# Patient Record
Sex: Male | Born: 1990 | Race: White | Hispanic: No | Marital: Single | Smoking: Never smoker
Health system: Southern US, Community
[De-identification: ages and names within clinical notes are randomized; demographics above are authoritative.]

## PROBLEM LIST (undated history)

## (undated) DIAGNOSIS — F329 Major depressive disorder, single episode, unspecified: Secondary | ICD-10-CM

## (undated) DIAGNOSIS — F32A Depression, unspecified: Secondary | ICD-10-CM

## (undated) HISTORY — DX: Major depressive disorder, single episode, unspecified: F32.9

## (undated) HISTORY — DX: Depression, unspecified: F32.A

---
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
## 2013-08-04 ENCOUNTER — Ambulatory Visit: Payer: BC Managed Care – PPO | Admitting: Emergency Medicine

## 2013-08-04 VITALS — BP 112/68 | HR 90 | Temp 99.4°F | Resp 20 | Ht 69.5 in | Wt 140.0 lb

## 2013-08-04 DIAGNOSIS — F329 Major depressive disorder, single episode, unspecified: Secondary | ICD-10-CM

## 2013-08-04 DIAGNOSIS — F3289 Other specified depressive episodes: Secondary | ICD-10-CM

## 2013-08-04 DIAGNOSIS — F32A Depression, unspecified: Secondary | ICD-10-CM

## 2013-08-04 MED ORDER — SERTRALINE HCL 50 MG PO TABS: 50.0000 mg | ORAL_TABLET | Freq: Every day | ORAL | Status: DC

## 2013-08-04 NOTE — Progress Notes (Signed)
[facility] [address], [place] [id number] [phone]  Date:  08/04/2013   Name:  Jacob BandaGriffin K Staubs   DOB:  Aug 21, 1990   MRN:  [id number]  PCP:  Tally DueGUEST, CHRIS WARREN, MD    Chief Complaint: Depression   History of Present Illness:  Jacob BandaGriffin K Thumm is a 23 y.o. very pleasant male patient who presents with the following:  Has suffered depression last term and again this.  Went to counseling center last semester and after first session though he was better and canceled the next appt.  Now has experienced worsening depression and has resumed treatment. Counselor is concerned said he feels anxious.  He says he has lost interest and focus in school and has abandoned his long standing friends.  Not doing as well as he was with classes.  Not eating and sleeping poorly.  No girlfriend.  Has appt next week with a psychologist.  Afraid to go out in crowds.  No improvement with over the counter medications or other home remedies. Denies other complaint or health concern today.   There are no active problems to display for this patient.   Past Medical History  Diagnosis Date  . Depression     History reviewed. No pertinent past surgical history.  History  Substance Use Topics  . Smoking status: Never Smoker   . Smokeless tobacco: Not on file  . Alcohol Use: No    History reviewed. No pertinent family history.  Not on File  Medication list has been reviewed and updated.  No current outpatient prescriptions on file prior to visit.   No current facility-administered medications on file prior to visit.    Review of Systems:  As per HPI, otherwise negative.    Physical Examination: Filed Vitals:   08/04/13 1440  BP: 112/68  Pulse: 90  Temp: 99.4 F (37.4 C)  Resp: 20   Filed Vitals:   08/04/13 1440  Height: 5' 9.5" (1.765 m)  Weight: 140 lb (63.504 kg)   Body mass index is 20.39 kg/(m^2). Ideal Body Weight: Weight in (lb) to have BMI = 25:  171.4  GEN: WDWN, NAD, Non-toxic, A & O x 3 HEENT: Atraumatic, Normocephalic. Neck supple. No masses, No LAD. Ears and Nose: No external deformity. CV: RRR, No M/G/R. No JVD. No thrill. No extra heart sounds. PULM: CTA B, no wheezes, crackles, rhonchi. No retractions. No resp. distress. No accessory muscle use. ABD: S, NT, ND, +BS. No rebound. No HSM. EXTR: No c/c/e NEURO Normal gait.  PSYCH: Normally interactive. Conversant. Not depressed or anxious appearing.  Calm demeanor.    Assessment and Plan: Social anxiety disorder Depression zoloft Follow up with therapist  Signed,  Phillips OdorJeffery Kandon Hosking, MD

## 2013-08-04 NOTE — Patient Instructions (Signed)

## 2013-12-22 ENCOUNTER — Other Ambulatory Visit: Payer: Self-pay | Admitting: Emergency Medicine

## 2015-12-03 ENCOUNTER — Encounter: Payer: Self-pay | Admitting: Podiatry

## 2015-12-03 ENCOUNTER — Ambulatory Visit (INDEPENDENT_AMBULATORY_CARE_PROVIDER_SITE_OTHER): Payer: BLUE CROSS/BLUE SHIELD | Admitting: Podiatry

## 2015-12-03 VITALS — BP 117/76 | HR 64 | Resp 16 | Ht 69.0 in | Wt 140.0 lb

## 2015-12-03 DIAGNOSIS — L603 Nail dystrophy: Secondary | ICD-10-CM

## 2015-12-03 NOTE — Progress Notes (Signed)
   Subjective:    Patient ID: Jacob Mcdowell, male    DOB: 01/30/1991, 25 y.o.   MRN: [id number]  HPI: Jacob Mcdowell presents with his mother today for chief complaint of a thickened discolored and deformed nail hallux right as well as the fifth digit of the left foot. He states they've been this way for quite some time and seem to be worsening. He's tried nothing to treat them.    Review of Systems  All other systems reviewed and are negative.      Objective:   Physical Exam: Vital signs are stable alert and oriented 3 pulses are palpable. Neurologic sensorium is intact deep tendon reflexes are intact muscle strength is 5 over 5 dorsiflexion plantar flexors and inverters everters all physical musculature is intact. Orthopedic evaluation of his rheumatologist is the ankle range of motion without crepitation. Cutaneous evaluationof the well-hydrated cutis thick yellow dystrophic clinically mycotic nail hallux right and fifth digit left foot.        Assessment & Plan:  Nail dystrophy hallux right fifth digit left.  Plan: A sample of the nails he will taken today to be sent for pathologic evaluation we will notify them of the results.

## 2016-01-07 ENCOUNTER — Encounter: Payer: Self-pay | Admitting: Podiatry

## 2016-01-07 ENCOUNTER — Ambulatory Visit (INDEPENDENT_AMBULATORY_CARE_PROVIDER_SITE_OTHER): Payer: BLUE CROSS/BLUE SHIELD | Admitting: Podiatry

## 2016-01-07 DIAGNOSIS — L603 Nail dystrophy: Secondary | ICD-10-CM

## 2016-01-07 DIAGNOSIS — Z79899 Other long term (current) drug therapy: Secondary | ICD-10-CM

## 2016-01-07 MED ORDER — TERBINAFINE HCL 250 MG PO TABS: 250.0000 mg | ORAL_TABLET | Freq: Every day | ORAL | Status: DC

## 2016-01-07 NOTE — Patient Instructions (Signed)

## 2016-01-07 NOTE — Progress Notes (Signed)
Jacob Mcdowell presents today for follow-up of his toenail cultures.  Objective: Vital signs are stable alert and oriented 3. Pulses are palpable. Pathology report does demonstrate positive for onychomycosis and amount of ice.  Assessment: Onychomycosis.  Plan: After lengthy discussion pros and cons of oral versus topical therapy versus laser therapy he chose oral therapy. We discussed this in great detail today the pros and cons of the medication itself the risk that he'll be taking utilizing this medication. Also we requested a liver profile to be performed and a requisition was provided. I prescribed Lamisil 250 mg tablets 1 by mouth daily 30 and he will follow-up with me in 1 month for another liver profile at which time 90 tablets will be dispensed.

## 2016-01-08 ENCOUNTER — Telehealth: Payer: Self-pay | Admitting: *Deleted

## 2016-01-08 LAB — HEPATIC FUNCTION PANEL
ALBUMIN: 5.1 g/dL (ref 3.6–5.1)
ALT: 11 U/L (ref 9–46)
AST: 17 U/L (ref 10–40)
Alkaline Phosphatase: 51 U/L (ref 40–115)
Bilirubin, Direct: 0.1 mg/dL (ref <=0.2)
Indirect Bilirubin: 0.6 mg/dL (ref 0.2–1.2)
Total Bilirubin: 0.7 mg/dL (ref 0.2–1.2)
Total Protein: 7.3 g/dL (ref 6.1–8.1)

## 2016-01-08 NOTE — Telephone Encounter (Addendum)
-----   Message from Elinor ParkinsonMax T Hyatt, [place] sent at 01/08/2016 12:13 PM EDT ----- Blood work looks good and may continue medication. Informed pt of Dr.Hyatt's orders.

## 2016-01-28 ENCOUNTER — Encounter: Payer: Self-pay | Admitting: Podiatry

## 2016-01-28 ENCOUNTER — Ambulatory Visit (INDEPENDENT_AMBULATORY_CARE_PROVIDER_SITE_OTHER): Payer: BLUE CROSS/BLUE SHIELD | Admitting: Podiatry

## 2016-01-28 DIAGNOSIS — L603 Nail dystrophy: Secondary | ICD-10-CM

## 2016-01-28 DIAGNOSIS — Z79899 Other long term (current) drug therapy: Secondary | ICD-10-CM

## 2016-01-28 MED ORDER — TERBINAFINE HCL 250 MG PO TABS: 250.0000 mg | ORAL_TABLET | Freq: Every day | ORAL | 0 refills | Status: DC

## 2016-01-28 NOTE — Progress Notes (Signed)
Jacob CruiseKristin presents today for follow-up of his onychomycosis and treatment with Lamisil. He states that he has had no problems taking the Lamisil. He denies fever chills nausea vomiting muscle aches and pains.  Objective: No change in the nail plates as of yet that I can see that he states that they look much better.  Assessment: Onychomycosis.  Plan: Long-term treatment with Lamisil. He will start his 90 day treatment regimen at this point. We requested another liver profile and CBC and we dispensed another 90 days with medication. I will follow-up with him in 4 months.

## 2016-06-04 ENCOUNTER — Ambulatory Visit (INDEPENDENT_AMBULATORY_CARE_PROVIDER_SITE_OTHER): Payer: BLUE CROSS/BLUE SHIELD | Admitting: Podiatry

## 2016-06-04 ENCOUNTER — Encounter: Payer: Self-pay | Admitting: Podiatry

## 2016-06-04 DIAGNOSIS — L603 Nail dystrophy: Secondary | ICD-10-CM | POA: Diagnosis not present

## 2016-06-04 DIAGNOSIS — Z79899 Other long term (current) drug therapy: Secondary | ICD-10-CM

## 2016-06-04 MED ORDER — TERBINAFINE HCL 250 MG PO TABS: 250.0000 mg | ORAL_TABLET | Freq: Every day | ORAL | 0 refills | Status: AC

## 2016-06-07 NOTE — Progress Notes (Signed)
He presents today for follow-up of his onychomycosis and the use of Lamisil. He states that he only took 2 months of the Lamisil but his nails are growing out nicely. He also goes on to state that he forgot to get his blood drawn.  Objective: Vital signs are stable he is alert and oriented 3. Pulses are palpable. Nail plates appear to be healing up proximally 50%.  Assessment: Long-term therapy for onychomycosis with Lamisil.  Plan: Continue use of Lamisil for 3 more months and I requested another liver profile while he is at home from college.

## 2016-10-27 ENCOUNTER — Encounter (INDEPENDENT_AMBULATORY_CARE_PROVIDER_SITE_OTHER): Payer: Self-pay | Admitting: Podiatry

## 2016-10-27 NOTE — Progress Notes (Signed)
This encounter was created in error - please disregard.

## 2019-03-11 DIAGNOSIS — Z131 Encounter for screening for diabetes mellitus: Secondary | ICD-10-CM | POA: Diagnosis not present

## 2019-03-11 DIAGNOSIS — Z0000 Encounter for general adult medical examination without abnormal findings: Secondary | ICD-10-CM | POA: Diagnosis not present

## 2019-03-11 DIAGNOSIS — Z23 Encounter for immunization: Secondary | ICD-10-CM | POA: Diagnosis not present

## 2019-06-14 ENCOUNTER — Ambulatory Visit: Payer: 59 | Attending: Internal Medicine

## 2019-06-14 DIAGNOSIS — Z20822 Contact with and (suspected) exposure to covid-19: Secondary | ICD-10-CM

## 2019-06-16 LAB — NOVEL CORONAVIRUS, NAA: SARS-CoV-2, NAA: NOT DETECTED

## 2020-06-06 ENCOUNTER — Telehealth: Payer: Self-pay | Admitting: Hematology

## 2020-06-06 NOTE — Telephone Encounter (Signed)
Received a new hem referral from [facility] for leukopenia. Mr. Jacob Mcdowell has been cld and scheduled to see Dr. Candise Che on 12/22 at 11am. Pt aware to arrive 30 minutes early.

## 2020-06-12 ENCOUNTER — Other Ambulatory Visit: Payer: Self-pay

## 2020-06-12 ENCOUNTER — Inpatient Hospital Stay: Payer: 59

## 2020-06-12 ENCOUNTER — Inpatient Hospital Stay: Payer: 59 | Attending: Hematology | Admitting: Hematology

## 2020-06-12 VITALS — BP 135/78 | HR 75 | Temp 97.6°F | Resp 18 | Ht 69.0 in | Wt 157.8 lb

## 2020-06-12 DIAGNOSIS — R112 Nausea with vomiting, unspecified: Secondary | ICD-10-CM | POA: Insufficient documentation

## 2020-06-12 DIAGNOSIS — E538 Deficiency of other specified B group vitamins: Secondary | ICD-10-CM | POA: Insufficient documentation

## 2020-06-12 DIAGNOSIS — R5383 Other fatigue: Secondary | ICD-10-CM | POA: Diagnosis not present

## 2020-06-12 DIAGNOSIS — D709 Neutropenia, unspecified: Secondary | ICD-10-CM | POA: Insufficient documentation

## 2020-06-12 DIAGNOSIS — F418 Other specified anxiety disorders: Secondary | ICD-10-CM | POA: Diagnosis not present

## 2020-06-12 DIAGNOSIS — R531 Weakness: Secondary | ICD-10-CM | POA: Insufficient documentation

## 2020-06-12 DIAGNOSIS — Z79899 Other long term (current) drug therapy: Secondary | ICD-10-CM | POA: Diagnosis not present

## 2020-06-12 LAB — CMP (CANCER CENTER ONLY)
ALT: 14 U/L (ref 0–44)
AST: 15 U/L (ref 15–41)
Albumin: 4.9 g/dL (ref 3.5–5.0)
Alkaline Phosphatase: 56 U/L (ref 38–126)
Anion gap: 8 (ref 5–15)
BUN: 10 mg/dL (ref 6–20)
CO2: 30 mmol/L (ref 22–32)
Calcium: 9.6 mg/dL (ref 8.9–10.3)
Chloride: 105 mmol/L (ref 98–111)
Creatinine: 0.82 mg/dL (ref 0.61–1.24)
GFR, Estimated: >60 mL/min (ref >60)
Glucose, Bld: 92 mg/dL (ref 70–99)
Potassium: 4.4 mmol/L (ref 3.5–5.1)
Sodium: 143 mmol/L (ref 135–145)
Total Bilirubin: 0.6 mg/dL (ref 0.3–1.2)
Total Protein: 8 g/dL (ref 6.5–8.1)

## 2020-06-12 LAB — CBC WITH DIFFERENTIAL/PLATELET
Abs Immature Granulocytes: 0 10*3/uL (ref 0.00–0.07)
Basophils Absolute: 0 10*3/uL (ref 0.0–0.1)
Basophils Relative: 1 %
Eosinophils Absolute: 0.1 10*3/uL (ref 0.0–0.5)
Eosinophils Relative: 3 %
HCT: 43.8 % (ref 39.0–52.0)
Hemoglobin: 14.8 g/dL (ref 13.0–17.0)
Immature Granulocytes: 0 %
Lymphocytes Relative: 30 %
Lymphs Abs: 1.4 10*3/uL (ref 0.7–4.0)
MCH: 31.2 pg (ref 26.0–34.0)
MCHC: 33.8 g/dL (ref 30.0–36.0)
MCV: 92.4 fL (ref 80.0–100.0)
Monocytes Absolute: 0.4 10*3/uL (ref 0.1–1.0)
Monocytes Relative: 9 %
Neutro Abs: 2.8 10*3/uL (ref 1.7–7.7)
Neutrophils Relative %: 57 %
Platelets: 251 10*3/uL (ref 150–400)
RBC: 4.74 MIL/uL (ref 4.22–5.81)
RDW: 11.8 % (ref 11.5–15.5)
WBC: 4.8 10*3/uL (ref 4.0–10.5)
nRBC: 0 % (ref 0.0–0.2)

## 2020-06-12 LAB — LACTATE DEHYDROGENASE: LDH: 151 U/L (ref 98–192)

## 2020-06-12 LAB — VITAMIN B12: Vitamin B-12: 765 pg/mL (ref 180–914)

## 2020-06-12 NOTE — Progress Notes (Signed)
HEMATOLOGY/ONCOLOGY CONSULTATION NOTE  Date of Service: 06/12/2020  Patient Care Team: Elias Elseeade, Robert, MD as PCP - General (Family Medicine)  CHIEF COMPLAINTS/PURPOSE OF CONSULTATION:  Neutropenia  HISTORY OF PRESENTING ILLNESS:   Jacob Mcdowell is a wonderful 29 y.o. male who has been referred to us by Raenette RoverWilliam Stein, PA-C for evaluation and management of neutropenia. The pt reports that he is doing well overall.   The pt reports persistent fatigue for 6-12 months that began suddenly. In August the pt was riding in a car when he suddenly began feeling nauseous and weak. Pt was bedridden for three days due to this. This has occurred on every car ride longer than 2 hours since then. Pt has no history of carsickness since he was a small child. Pt denies dizziness or a spinning sensation during these episodes. He is now experiencing nausea, weakness, and fatigue on a regular basis. He notes that anxiety appears to amplify these symptoms. He associates activity with feeling weak and nauseous.   Pt has been seen by an ENT, who cleaned his ears and did a throat evaluation. Pt was having, and continues to have a feeling of fullness in his throat. He denies difficulty swallowing but has intermittent throat soreness. In the last 1-2 weeks he has noticed dark brown stools. Pt has no dietary associations with episodes of diarrhea. Pt was found to have low Vitamin B12 two months ago. He has since been receiving weekly B12 injections, as well as taking sublingual Vitamin B12. His thyroid function was checked and found to be nml. Pt had a positive ANA last week.   Pt has been on Zoloft for about 7 years. He has been taking Ibuprofen almost every other day. 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 He is planning on changing jobs soon, which is causing some stress.  Most recent lab results (06/05/2020) of CBC is as follows: all values are WNL except for WBC at 3.6K, Neutro Abs  at 1.6K. 06/05/2020 Vitamin B12 at 914  On review of systems, pt reports fatigue, weakness, nausea, anxiety, throat fullness and denies abdominal pain/distention, rhinorrhea, dysphagia, pharyngitis, joint pain/swelling, rash, mouth sores, unexpected weight loss, fevers, chills, night sweats, tingling/numbness in hands/feet, hearing changes and any other symptoms.   On PMHx the pt reports Wisdom tooth extraction, Anxiety, Vitamin B12 deficiency. On Social Hx the pt reports a history vaping. He has used flavored vapes as well as e-cigarettes. He was a heavy marijuana user until eight months ago. He now uses marijuana occasionally. Pt has never been a cigarette smoker. Pt was previously drinking three alcoholic beverages per week but has not used any alcohol in the last few months.  On Family Hx the pt reports that his mother has hypothyroidism, diagnosed in her 20-30's. His paternal grandparents passed from Lung Cancer and were smokers.    MEDICAL HISTORY:  Past Medical History:  Diagnosis Date  . Depression     SURGICAL HISTORY: Wisdom tooth extraction  SOCIAL HISTORY: Social History   Socioeconomic History  . Marital status: Single    Spouse name: Not on file  . Number of children: Not on file  . Years of education: Not on file  . Highest education level: Not on file  Occupational History  . Not on file  Tobacco Use  . Smoking status: Never Smoker  . Smokeless tobacco: Not on file  Substance and Sexual Activity  . Alcohol use: No  .  Drug use: No  . Sexual activity: Not on file  Other Topics Concern  . Not on file  Social History Narrative  . Not on file   Social Determinants of Health   Financial Resource Strain: Not on file  Food Insecurity: Not on file  Transportation Needs: Not on file  Physical Activity: Not on file  Stress: Not on file  Social Connections: Not on file  Intimate Partner Violence: Not on file    FAMILY HISTORY: No family history on  file.  ALLERGIES:  has No Known Allergies.  MEDICATIONS:  Current Outpatient Medications  Medication Sig Dispense Refill  . sertraline (ZOLOFT) 50 MG tablet TAKE 1 TABLET BY MOUTH EVERY DAY 30 tablet 1  . terbinafine (LAMISIL) 250 MG tablet Take 1 tablet (250 mg total) by mouth daily. 90 tablet 0   No current facility-administered medications for this visit.    REVIEW OF SYSTEMS:    10 Point review of Systems was done is negative except as noted above.  PHYSICAL EXAMINATION: ECOG PERFORMANCE STATUS: 1 - Symptomatic but completely ambulatory  . Vitals:   06/12/20 1145  BP: 135/78  Pulse: 75  Resp: 18  Temp: 97.6 F (36.4 C)  SpO2: 99%   Filed Weights   06/12/20 1145  Weight: 157 lb 12.8 oz (71.6 kg)   .Body mass index is 23.3 kg/m.  GENERAL:alert, in no acute distress and comfortable SKIN: no acute rashes, no significant lesions EYES: conjunctiva are pink and non-injected, sclera anicteric OROPHARYNX: MMM, no exudates, no oropharyngeal erythema or ulceration NECK: supple, no JVD LYMPH:  no palpable lymphadenopathy in the cervical, axillary or inguinal regions LUNGS: clear to auscultation b/l with normal respiratory effort HEART: regular rate & rhythm ABDOMEN:  normoactive bowel sounds , non tender, not distended. No palpable hepato-splenomegaly. Extremity: no pedal edema PSYCH: alert & oriented x 3 with fluent speech NEURO: no focal motor/sensory deficits  LABORATORY DATA:  I have reviewed the data as listed  . CBC Latest Ref Rng & Units 06/12/2020  WBC 4.0 - 10.5 K/uL 4.8  Hemoglobin 13.0 - 17.0 g/dL 16.1  Hematocrit 09.6 - 52.0 % 43.8  Platelets 150 - 400 K/uL 251   . CBC    Component Value Date/Time   WBC 4.8 06/12/2020 1237   RBC 4.74 06/12/2020 1237   HGB 14.8 06/12/2020 1237   HCT 43.8 06/12/2020 1237   PLT 251 06/12/2020 1237   MCV 92.4 06/12/2020 1237   MCH 31.2 06/12/2020 1237   MCHC 33.8 06/12/2020 1237   RDW 11.8 06/12/2020 1237    LYMPHSABS 1.4 06/12/2020 1237   MONOABS 0.4 06/12/2020 1237   EOSABS 0.1 06/12/2020 1237   BASOSABS 0.0 06/12/2020 1237   ANC 2.8k  . CMP Latest Ref Rng & Units 06/12/2020 01/07/2016  Glucose 70 - 99 mg/dL 92 -  BUN 6 - 20 mg/dL 10 -  Creatinine 0.45 - 1.24 mg/dL 4.09 -  Sodium 811 - 914 mmol/L 143 -  Potassium 3.5 - 5.1 mmol/L 4.4 -  Chloride 98 - 111 mmol/L 105 -  CO2 22 - 32 mmol/L 30 -  Calcium 8.9 - 10.3 mg/dL 9.6 -  Total Protein 6.5 - 8.1 g/dL 8.0 7.3  Total Bilirubin 0.3 - 1.2 mg/dL 0.6 0.7  Alkaline Phos 38 - 126 U/L 56 51  AST 15 - 41 U/L 15 17  ALT 0 - 44 U/L 14 11   . Lab Results  Component Value Date   LDH 151 06/12/2020  Component     Latest Ref Rng & Units 06/12/2020  Antigliadin Abs, IgA     0 - 19 units 4  Deamidated Gliadin Abs, IgG     0 - 19 units 3  Tissue Transglutaminase Ab, IgA     0 - 3 U/mL <2  Tissue Transglut Ab     0 - 5 U/mL 2  Endomysial Ab, IgA     Negative Negative  IgA     90 - 386 mg/dL 623  Parietal Cell Antibody-IgG     0.0 - 20.0 Units 2.4  Intrinsic Factor     0.0 - 1.1 AU/mL 1.0  Vitamin B12     180 - 914 pg/mL 765    RADIOGRAPHIC STUDIES: I have personally reviewed the radiological images as listed and agreed with the findings in the report. No results found.  ASSESSMENT & PLAN:    29 yo with   1) Transient leucoopenia/neutropenia - now resolved. ? Related to B12 deficiency. Resolving viral infection.  PLAN: -Discussed patient's most recent labs from 06/05/2020, all values are WNL except for WBC at 3.6K, Neutro Abs at 1.6K. -Advised pt that improving neutropenia/leukopenia and nml WBC & PLT are not suggestive of a primary bone marrow disorder.  -Advised pt that early aplastic anemias can present in this manner, but this is not the most likely case. -Advised pt that certain viruses could cause the symptoms and lab findings that we are seeing.  -Advised pt that Celiac disease could cause issues with nutrient  absorption and changes in bowel habits.   -Advised pt that he could be experiencing cyclic vomiting syndrome related to his recently discontinued marijuana use.  -Advised pt that the vaping industry is not regulated and vapes have been linked to lung injury. Recommend he continue vaping cessation. -Advised pt that PO Iron and Pepto Bismol can cause darkening of stools.  -Will get labs today  -Will see back as needed based on labs   FOLLOW UP: Labs today RTC with Dr Candise Che based on labs  . Orders Placed This Encounter  Procedures  . CBC with Differential/Platelet    Standing Status:   Future    Number of Occurrences:   1    Standing Expiration Date:   06/12/2021  . CMP (Cancer Center only)    Standing Status:   Future    Number of Occurrences:   1    Standing Expiration Date:   06/12/2021  . Vitamin B12    Standing Status:   Future    Number of Occurrences:   1    Standing Expiration Date:   06/12/2021  . Celiac panel 10    Standing Status:   Future    Number of Occurrences:   1    Standing Expiration Date:   06/12/2021  . Intrinsic factor antibodies    Standing Status:   Future    Number of Occurrences:   1    Standing Expiration Date:   06/12/2021  . Anti-parietal antibody    Standing Status:   Future    Number of Occurrences:   1    Standing Expiration Date:   06/12/2021  . Lactate dehydrogenase    Standing Status:   Future    Number of Occurrences:   1    Standing Expiration Date:   06/12/2021    All of the patients questions were answered with apparent satisfaction. The patient knows to call the clinic with any problems, questions or concerns.  I spent counseling the patient face to face. The total time spent in the appointment was 45 minutes and more than 50% was on counseling and direct patient cares.    Wyvonnia Lora MD MS AAHIVMS [facility] Hematology/Oncology Physician [facility]  (Office):       [phone] (Work cell):   [phone] (Fax):           [phone]  06/12/2020 4:52 PM  I, Carollee Herter, am acting as a scribe for Dr. Wyvonnia Lora.   .I have reviewed the above documentation for accuracy and completeness, and I agree with the above. Johney Maine MD

## 2020-06-13 LAB — ANTI-PARIETAL ANTIBODY: Parietal Cell Antibody-IgG: 2.4 Units (ref 0.0–20.0)

## 2020-06-13 LAB — INTRINSIC FACTOR ANTIBODIES: Intrinsic Factor: 1 AU/mL (ref 0.0–1.1)

## 2020-06-14 LAB — CELIAC PANEL 10
Antigliadin Abs, IgA: 4 units (ref 0–19)
Endomysial Ab, IgA: NEGATIVE
Gliadin IgG: 3 units (ref 0–19)
IgA: 154 mg/dL (ref 90–386)
Tissue Transglut Ab: 2 U/mL (ref 0–5)
Tissue Transglutaminase Ab, IgA: <2 U/mL (ref 0–3)

## 2020-06-19 ENCOUNTER — Telehealth: Payer: Self-pay | Admitting: *Deleted

## 2020-06-19 NOTE — Telephone Encounter (Signed)
Contacted patient regarding test results per Dr. Kale's directions. Patient verbalized understanding of all information.  

## 2020-06-19 NOTE — Telephone Encounter (Signed)
-----   Message from Johney Maine, MD sent at 06/18/2020  9:14 PM EST ----- Plz let patient know his WBC counts have completely normalized. B12 level are normal. Antibody testing negative for pernicious anemia and celiac disease. No additional hematologic workup recommended. F/u with PCP. thx

## 2020-07-02 DIAGNOSIS — Z20822 Contact with and (suspected) exposure to covid-19: Secondary | ICD-10-CM | POA: Diagnosis not present

## 2020-07-02 DIAGNOSIS — U071 COVID-19: Secondary | ICD-10-CM | POA: Diagnosis not present

## 2020-10-01 DIAGNOSIS — F321 Major depressive disorder, single episode, moderate: Secondary | ICD-10-CM | POA: Diagnosis not present

## 2020-10-01 DIAGNOSIS — F411 Generalized anxiety disorder: Secondary | ICD-10-CM | POA: Diagnosis not present

## 2020-12-06 DIAGNOSIS — Z0000 Encounter for general adult medical examination without abnormal findings: Secondary | ICD-10-CM | POA: Diagnosis not present

## 2020-12-06 DIAGNOSIS — Z13220 Encounter for screening for lipoid disorders: Secondary | ICD-10-CM | POA: Diagnosis not present

## 2020-12-06 DIAGNOSIS — E538 Deficiency of other specified B group vitamins: Secondary | ICD-10-CM | POA: Diagnosis not present

## 2021-01-02 DIAGNOSIS — F419 Anxiety disorder, unspecified: Secondary | ICD-10-CM | POA: Diagnosis not present

## 2021-01-02 DIAGNOSIS — R5383 Other fatigue: Secondary | ICD-10-CM | POA: Diagnosis not present

## 2021-01-06 DIAGNOSIS — R5383 Other fatigue: Secondary | ICD-10-CM | POA: Diagnosis not present

## 2021-01-06 DIAGNOSIS — Z113 Encounter for screening for infections with a predominantly sexual mode of transmission: Secondary | ICD-10-CM | POA: Diagnosis not present

## 2021-01-29 DIAGNOSIS — J3489 Other specified disorders of nose and nasal sinuses: Secondary | ICD-10-CM | POA: Diagnosis not present

## 2021-01-29 DIAGNOSIS — K219 Gastro-esophageal reflux disease without esophagitis: Secondary | ICD-10-CM | POA: Diagnosis not present

## 2021-06-25 DIAGNOSIS — R5383 Other fatigue: Secondary | ICD-10-CM | POA: Diagnosis not present

## 2021-06-25 DIAGNOSIS — K219 Gastro-esophageal reflux disease without esophagitis: Secondary | ICD-10-CM | POA: Diagnosis not present

## 2021-06-26 ENCOUNTER — Encounter (HOSPITAL_BASED_OUTPATIENT_CLINIC_OR_DEPARTMENT_OTHER): Payer: Self-pay | Admitting: Obstetrics and Gynecology

## 2021-06-26 ENCOUNTER — Other Ambulatory Visit: Payer: Self-pay

## 2021-06-26 DIAGNOSIS — Y900 Blood alcohol level of less than 20 mg/100 ml: Secondary | ICD-10-CM | POA: Insufficient documentation

## 2021-06-26 DIAGNOSIS — Z79899 Other long term (current) drug therapy: Secondary | ICD-10-CM | POA: Diagnosis not present

## 2021-06-26 DIAGNOSIS — R5383 Other fatigue: Secondary | ICD-10-CM | POA: Insufficient documentation

## 2021-06-26 DIAGNOSIS — R7401 Elevation of levels of liver transaminase levels: Secondary | ICD-10-CM | POA: Diagnosis not present

## 2021-06-26 DIAGNOSIS — R110 Nausea: Secondary | ICD-10-CM | POA: Insufficient documentation

## 2021-06-26 DIAGNOSIS — R799 Abnormal finding of blood chemistry, unspecified: Secondary | ICD-10-CM | POA: Diagnosis not present

## 2021-06-26 DIAGNOSIS — R1011 Right upper quadrant pain: Secondary | ICD-10-CM | POA: Insufficient documentation

## 2021-06-26 DIAGNOSIS — R945 Abnormal results of liver function studies: Secondary | ICD-10-CM | POA: Diagnosis not present

## 2021-06-26 LAB — CBC
HCT: 42.1 % (ref 39.0–52.0)
Hemoglobin: 14.3 g/dL (ref 13.0–17.0)
MCH: 31.4 pg (ref 26.0–34.0)
MCHC: 34 g/dL (ref 30.0–36.0)
MCV: 92.3 fL (ref 80.0–100.0)
Platelets: 276 10*3/uL (ref 150–400)
RBC: 4.56 MIL/uL (ref 4.22–5.81)
RDW: 12 % (ref 11.5–15.5)
WBC: 6.3 10*3/uL (ref 4.0–10.5)
nRBC: 0 % (ref 0.0–0.2)

## 2021-06-26 LAB — COMPREHENSIVE METABOLIC PANEL
ALT: 244 U/L — ABNORMAL HIGH (ref 0–44)
AST: 501 U/L — ABNORMAL HIGH (ref 15–41)
Albumin: 4.7 g/dL (ref 3.5–5.0)
Alkaline Phosphatase: 53 U/L (ref 38–126)
Anion gap: 8 (ref 5–15)
BUN: 13 mg/dL (ref 6–20)
CO2: 30 mmol/L (ref 22–32)
Calcium: 9.7 mg/dL (ref 8.9–10.3)
Chloride: 101 mmol/L (ref 98–111)
Creatinine, Ser: 0.63 mg/dL (ref 0.61–1.24)
GFR, Estimated: >60 mL/min (ref >60)
Glucose, Bld: 87 mg/dL (ref 70–99)
Potassium: 3.9 mmol/L (ref 3.5–5.1)
Sodium: 139 mmol/L (ref 135–145)
Total Bilirubin: 0.4 mg/dL (ref 0.3–1.2)
Total Protein: 7.9 g/dL (ref 6.5–8.1)

## 2021-06-26 LAB — URINALYSIS, ROUTINE W REFLEX MICROSCOPIC
Bilirubin Urine: NEGATIVE
Glucose, UA: NEGATIVE mg/dL
Ketones, ur: NEGATIVE mg/dL
Leukocytes,Ua: NEGATIVE
Nitrite: NEGATIVE
Protein, ur: NEGATIVE mg/dL
Specific Gravity, Urine: 1.019 (ref 1.005–1.030)
pH: 6 (ref 5.0–8.0)

## 2021-06-26 LAB — HEPATITIS PANEL, ACUTE
HCV Ab: NONREACTIVE
Hep A IgM: NONREACTIVE
Hep B C IgM: NONREACTIVE
Hepatitis B Surface Ag: NONREACTIVE

## 2021-06-26 LAB — ACETAMINOPHEN LEVEL: Acetaminophen (Tylenol), Serum: <10 ug/mL — ABNORMAL LOW (ref 10–30)

## 2021-06-26 LAB — ETHANOL: Alcohol, Ethyl (B): <10 mg/dL (ref <10)

## 2021-06-26 LAB — LIPASE, BLOOD: Lipase: 31 U/L (ref 11–51)

## 2021-06-26 NOTE — ED Triage Notes (Signed)
Patient sent to the ER for Liver evaluation after having elevated LFTs. Patient reports he has some discomfort in his abdomen. Patient reports he has had this problem for 1.5 years.

## 2021-06-27 ENCOUNTER — Emergency Department (HOSPITAL_BASED_OUTPATIENT_CLINIC_OR_DEPARTMENT_OTHER): Payer: BC Managed Care – PPO

## 2021-06-27 ENCOUNTER — Emergency Department (HOSPITAL_BASED_OUTPATIENT_CLINIC_OR_DEPARTMENT_OTHER)
Admission: EM | Admit: 2021-06-27 | Discharge: 2021-06-27 | Disposition: A | Discharge status: Home / Self Care | Payer: BC Managed Care – PPO | Attending: Emergency Medicine | Admitting: Emergency Medicine

## 2021-06-27 DIAGNOSIS — R748 Abnormal levels of other serum enzymes: Secondary | ICD-10-CM

## 2021-06-27 DIAGNOSIS — R945 Abnormal results of liver function studies: Secondary | ICD-10-CM | POA: Diagnosis not present

## 2021-06-27 NOTE — ED Notes (Signed)
Discharge instructions discussed with pt. Pt verbalized understanding. Pt stable and ambulatory.  °

## 2021-06-27 NOTE — Discharge Instructions (Signed)
Your liver enzymes were high today.  Drink plenty of fluids.  Avoid acetaminophen containing products and alcohol.  Please call to follow-up with a gastroenterologist.  Get rechecked sooner if you develop yellowing of your skin or eyes, severe abdominal pain or new concerning symptoms.

## 2021-06-27 NOTE — ED Provider Notes (Signed)
[facility] EMERGENCY DEPT Provider Note   CSN: [id number] Arrival date & time: 06/26/21  1759     History  Chief Complaint  Patient presents with   Abnormal Lab    Jacob Mcdowell is a 31 y.o. male.  The history is provided by the patient.  Abnormal Lab Jacob Mcdowell is a 31 y.o. male who presents to the Emergency Department complaining of abnormal lab.  He was seen in urgent care yesterday and had LFTs drawn, which were elevated and was referred to the ED for further evaluation.  He states he has not felt well for the last year and a half.  He frequently gets nausea.  He has associated fatigue.  No abdominal pain, fever, vomiting.  Has diarrhea.  No dysuria.  Sleeps hot, gets sweaty at night some times.  No weight loss.    Currently on zoloft for depression.    Does not take OTC meds.  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  Drinks alcohol two days a week, averages three drinks per week.  Former marijuana use.    Grandparents with lung cancer, brain cancer.    He was sick with URI sxs over christmas to new years - did use OTC cold remedies at that time but did not take more than recommended doses.      Home Medications Prior to Admission medications   Medication Sig Start Date End Date Taking? Authorizing Provider  sertraline (ZOLOFT) 50 MG tablet TAKE 1 TABLET BY MOUTH EVERY DAY    Weber, Sarah L, PA-C  terbinafine (LAMISIL) 250 MG tablet Take 1 tablet (250 mg total) by mouth daily. 06/04/16   Hyatt, Max T, DPM      Allergies    Patient has no known allergies.    Review of Systems   Review of Systems  All other systems reviewed and are negative.  Physical Exam Updated Vital Signs BP 131/75 (BP Location: Right Arm)    Pulse 75    Temp 98 F (36.7 C)    Resp 18    Ht 5\' 9"  (1.753 m)    Wt 72.6 kg    SpO2 100%    BMI 23.63 kg/m  Physical Exam Vitals and nursing note reviewed.  Constitutional:      Appearance: He is well-developed.  HENT:      Head: Normocephalic and atraumatic.  Cardiovascular:     Rate and Rhythm: Normal rate and regular rhythm.     Heart sounds: No murmur heard. Pulmonary:     Effort: Pulmonary effort is normal. No respiratory distress.     Breath sounds: Normal breath sounds.  Abdominal:     Palpations: Abdomen is soft.     Tenderness: There is no abdominal tenderness. There is no guarding or rebound.  Musculoskeletal:        General: No swelling or tenderness.  Skin:    General: Skin is warm and dry.  Neurological:     Mental Status: He is alert and oriented to person, place, and time.  Psychiatric:        Behavior: Behavior normal.    ED Results / Procedures / Treatments   Labs (all labs ordered are listed, but only abnormal results are displayed) Labs Reviewed  COMPREHENSIVE METABOLIC PANEL - Abnormal; Notable for the following components:      Result Value   AST 501 (*)    ALT 244 (*)    All other components within normal limits  URINALYSIS, ROUTINE W REFLEX MICROSCOPIC - Abnormal; Notable for the following components:   Hgb urine dipstick TRACE (*)    All other components within normal limits  ACETAMINOPHEN LEVEL - Abnormal; Notable for the following components:   Acetaminophen (Tylenol), Serum <10 (*)    All other components within normal limits  LIPASE, BLOOD  CBC  HEPATITIS PANEL, ACUTE  ETHANOL    EKG None  Radiology US Abdomen Limited RUQ (LIVER/GB)  Result Date: 06/27/2021 CLINICAL DATA:  Elevated liver function tests. EXAM: ULTRASOUND ABDOMEN LIMITED RIGHT UPPER QUADRANT COMPARISON:  None. FINDINGS: Gallbladder: No gallstones or wall thickening visualized (1.7 mm). No sonographic Murphy sign noted by sonographer. Common bile duct: Diameter: 3.0 mm Liver: No focal lesion identified. Within normal limits in parenchymal echogenicity. Portal vein is patent on color Doppler imaging with normal direction of blood flow towards the liver. Other: None. IMPRESSION: Normal right upper  quadrant ultrasound. Electronically Signed   By: Virgina Norfolk M.D.   On: 06/27/2021 01:18    Procedures Procedures    Medications Ordered in ED Medications - No data to display  ED Course/ Medical Decision Making/ A&P                           Medical Decision Making  Patient referred to the emergency department for abnormal LFTs.  He has fatigue, nausea for the last year and a half.  He did recently have a URI.  He has no significant abdominal tenderness on examination.  He is well-hydrated appearing.  Labs today with elevation in his transaminases, improved when compared to yesterday's.  He is negative for hepatitis.  No additional significant lab abnormalities.  Right upper quadrant ultrasound is without acute abnormality.  On record review he has been positive for ANA in the past and has been followed for leukopenia.  Discussed with patient unclear source of symptoms but concern for possible underlying autoimmune condition and feel that he would benefit from further evaluation by gastroenterology as an outpatient.  Discussed outpatient follow-up as well as return precautions.  Recommend that he avoid acetaminophen as well as alcohol in the meantime.        Final Clinical Impression(s) / ED Diagnoses Final diagnoses:  Elevated liver enzymes    Rx / DC Orders ED Discharge Orders     None         Quintella Reichert, MD 06/27/21 [phone]

## 2021-07-02 DIAGNOSIS — R7989 Other specified abnormal findings of blood chemistry: Secondary | ICD-10-CM | POA: Diagnosis not present

## 2021-07-02 DIAGNOSIS — R0989 Other specified symptoms and signs involving the circulatory and respiratory systems: Secondary | ICD-10-CM | POA: Diagnosis not present

## 2021-07-02 DIAGNOSIS — R945 Abnormal results of liver function studies: Secondary | ICD-10-CM | POA: Diagnosis not present

## 2021-07-31 DIAGNOSIS — F321 Major depressive disorder, single episode, moderate: Secondary | ICD-10-CM | POA: Diagnosis not present

## 2021-07-31 DIAGNOSIS — F411 Generalized anxiety disorder: Secondary | ICD-10-CM | POA: Diagnosis not present

## 2021-09-10 DIAGNOSIS — K219 Gastro-esophageal reflux disease without esophagitis: Secondary | ICD-10-CM | POA: Diagnosis not present

## 2021-09-10 DIAGNOSIS — F418 Other specified anxiety disorders: Secondary | ICD-10-CM | POA: Diagnosis not present

## 2021-09-10 DIAGNOSIS — R5383 Other fatigue: Secondary | ICD-10-CM | POA: Diagnosis not present

## 2021-10-24 DIAGNOSIS — R5383 Other fatigue: Secondary | ICD-10-CM | POA: Diagnosis not present

## 2021-10-30 DIAGNOSIS — R945 Abnormal results of liver function studies: Secondary | ICD-10-CM | POA: Diagnosis not present

## 2021-10-30 DIAGNOSIS — K625 Hemorrhage of anus and rectum: Secondary | ICD-10-CM | POA: Diagnosis not present

## 2021-10-30 DIAGNOSIS — K219 Gastro-esophageal reflux disease without esophagitis: Secondary | ICD-10-CM | POA: Diagnosis not present

## 2021-10-30 DIAGNOSIS — R195 Other fecal abnormalities: Secondary | ICD-10-CM | POA: Diagnosis not present

## 2021-12-16 DIAGNOSIS — E063 Autoimmune thyroiditis: Secondary | ICD-10-CM | POA: Diagnosis not present

## 2021-12-16 DIAGNOSIS — E039 Hypothyroidism, unspecified: Secondary | ICD-10-CM | POA: Diagnosis not present

## 2021-12-16 DIAGNOSIS — R197 Diarrhea, unspecified: Secondary | ICD-10-CM | POA: Diagnosis not present

## 2022-02-05 DIAGNOSIS — F411 Generalized anxiety disorder: Secondary | ICD-10-CM | POA: Diagnosis not present

## 2022-02-05 DIAGNOSIS — F321 Major depressive disorder, single episode, moderate: Secondary | ICD-10-CM | POA: Diagnosis not present

## 2022-08-11 DIAGNOSIS — F411 Generalized anxiety disorder: Secondary | ICD-10-CM | POA: Diagnosis not present

## 2022-08-11 DIAGNOSIS — F321 Major depressive disorder, single episode, moderate: Secondary | ICD-10-CM | POA: Diagnosis not present

## 2022-09-08 IMAGING — US US ABDOMEN LIMITED
1 series · 14 of 25 positions shown · non-contrast
Comparison: None.

CLINICAL DATA: Elevated liver function tests.

EXAM:
ULTRASOUND ABDOMEN LIMITED RIGHT UPPER QUADRANT

[Series 1: us abdomen limited ruq (liver/gb) · 75 acquisitions, 14 frames shown]
[im 1/75]
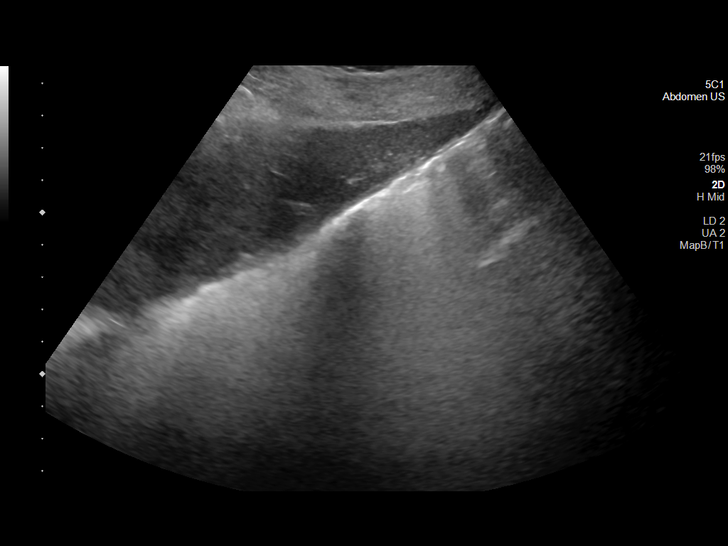
[im 7/75]
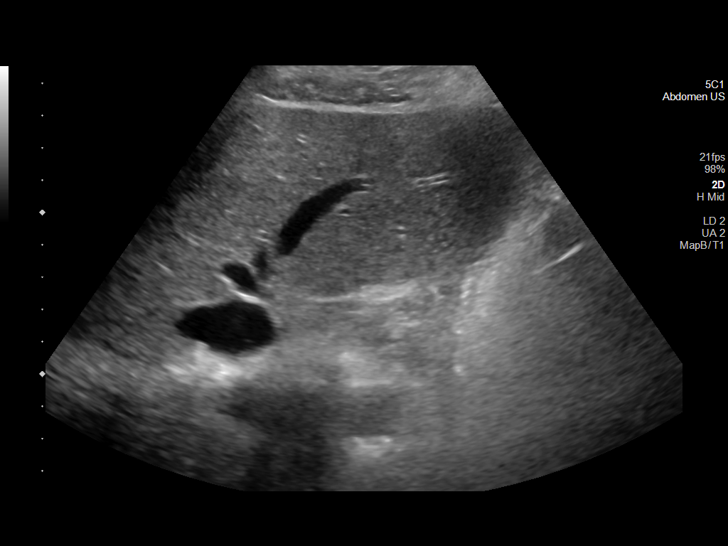
[im 13/75]
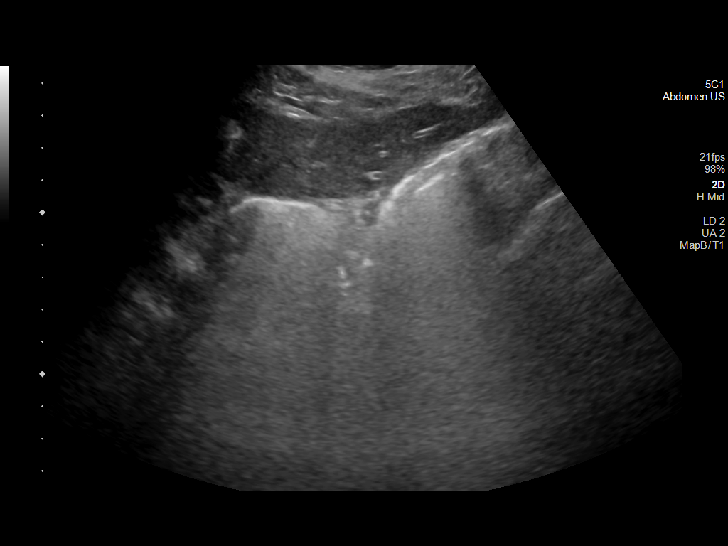
[im 19/75]
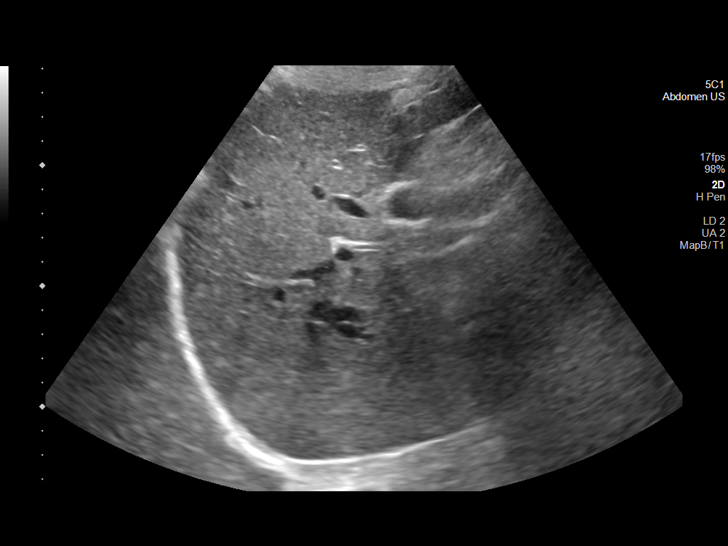
[im 25/75]
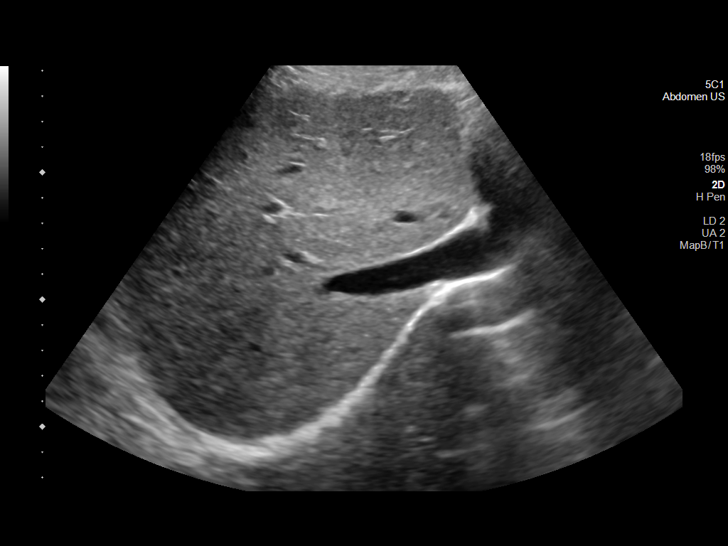
[im 28/75]
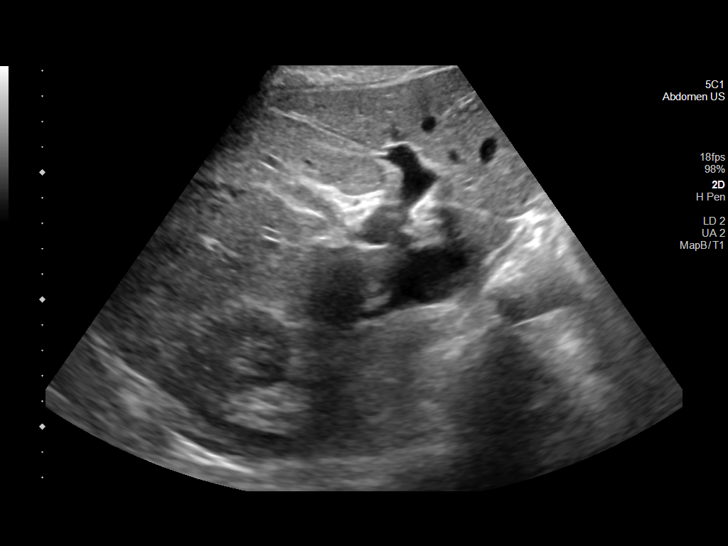
[im 34/75]
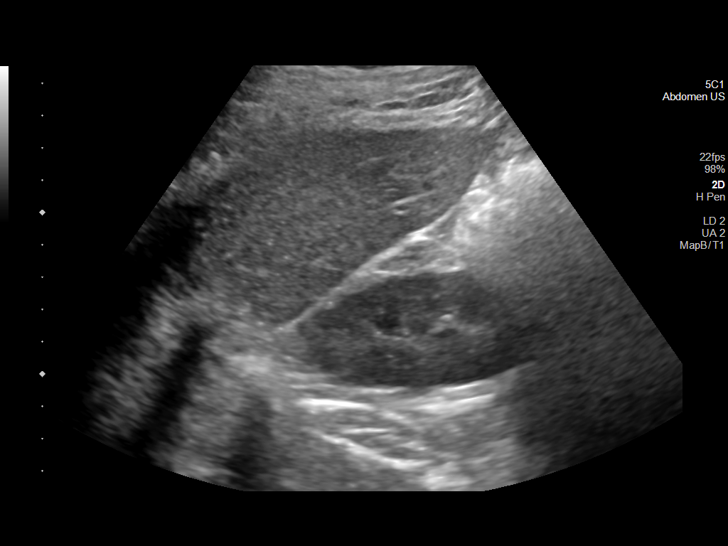
[im 41/75]
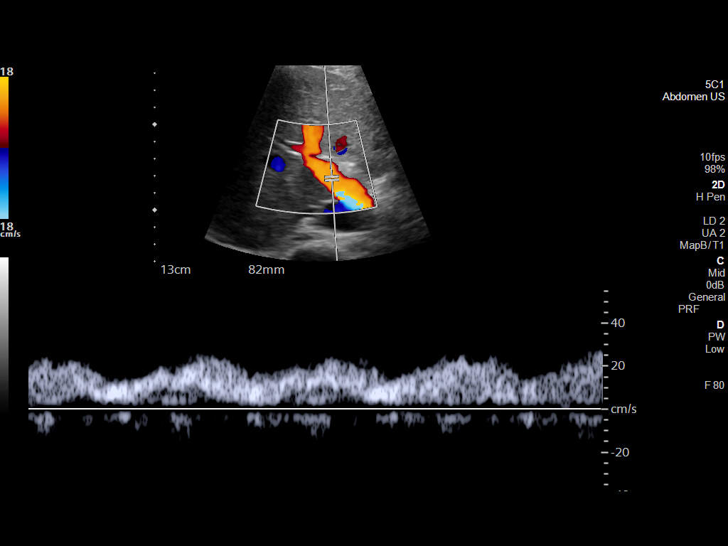
[im 47/75]
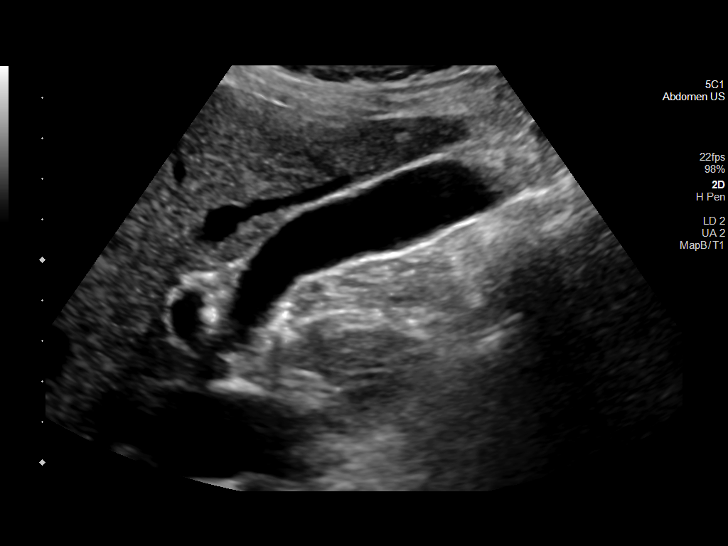
[im 50/75]
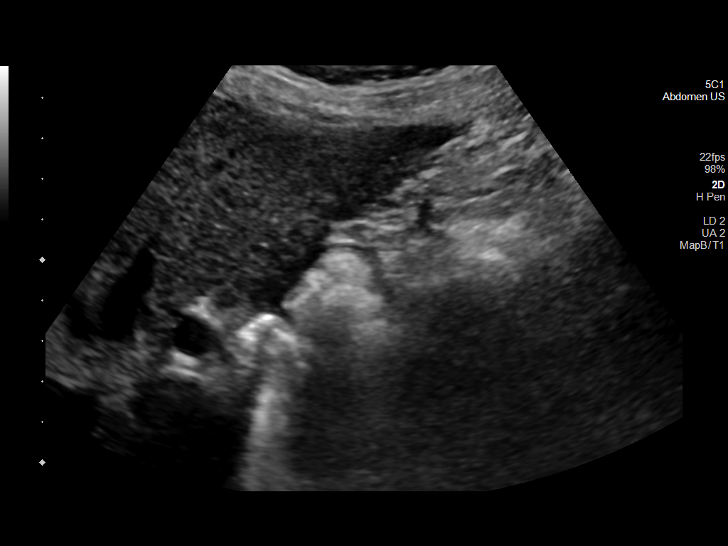
[im 56/75]
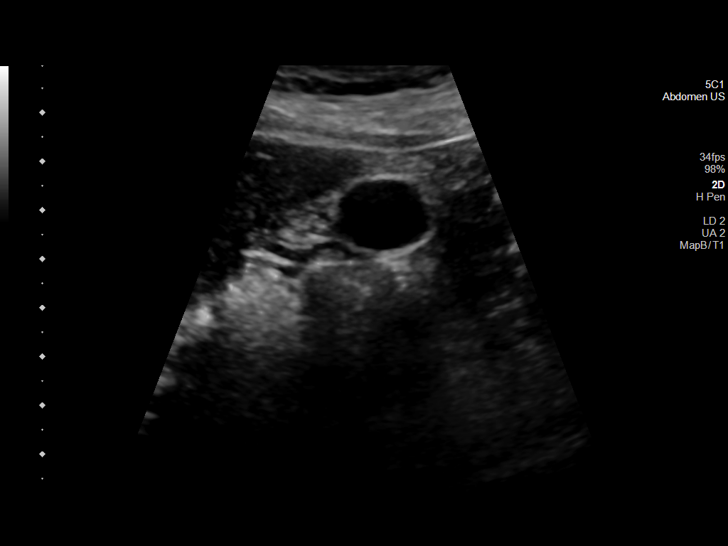
[im 62/75]
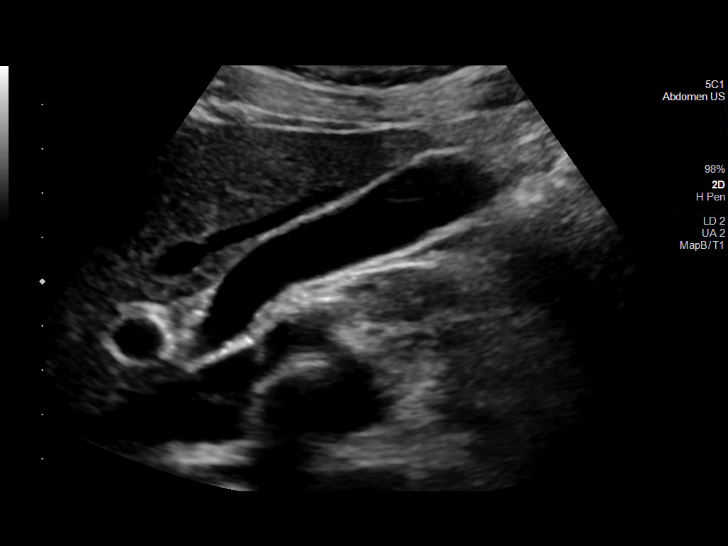
[im 68/75]
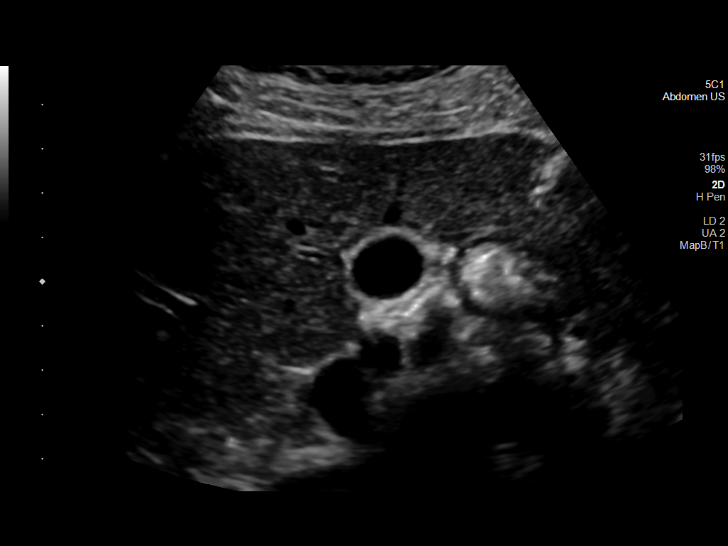
[im 75/75]
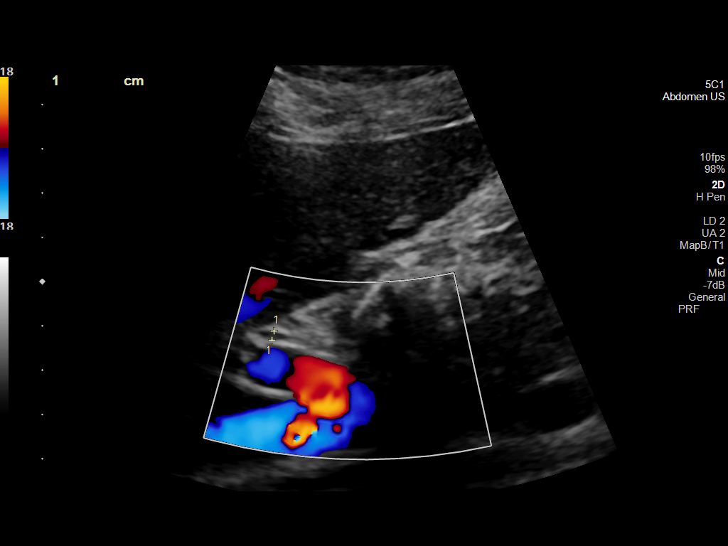

[14 of 25 positions shown; findings below may reference images not displayed]

FINDINGS: Gallbladder:

No gallstones or wall thickening visualized (1.7 mm). No sonographic
Murphy sign noted by sonographer.

Common bile duct:

Diameter: 3.0 mm

Liver:

No focal lesion identified. Within normal limits in parenchymal
echogenicity. Portal vein is patent on color Doppler imaging with
normal direction of blood flow towards the liver.

Other: None.
IMPRESSION: Normal right upper quadrant ultrasound.

## 2022-10-26 ENCOUNTER — Ambulatory Visit
Admission: EM | Admit: 2022-10-26 | Discharge: 2022-10-26 | Disposition: A | Discharge status: Home / Self Care | Payer: BC Managed Care – PPO | Attending: Urgent Care | Admitting: Urgent Care

## 2022-10-26 DIAGNOSIS — Z1152 Encounter for screening for COVID-19: Secondary | ICD-10-CM | POA: Diagnosis not present

## 2022-10-26 DIAGNOSIS — B349 Viral infection, unspecified: Secondary | ICD-10-CM | POA: Diagnosis not present

## 2022-10-26 LAB — POCT INFLUENZA A/B
Influenza A, POC: NEGATIVE
Influenza B, POC: NEGATIVE

## 2022-10-26 MED ORDER — ONDANSETRON 8 MG PO TBDP: 8.0000 mg | ORAL_TABLET | Freq: Three times a day (TID) | ORAL | 0 refills | Status: AC | PRN

## 2022-10-26 MED ORDER — CETIRIZINE HCL 10 MG PO TABS: 10.0000 mg | ORAL_TABLET | Freq: Every day | ORAL | 0 refills | Status: AC

## 2022-10-26 MED ORDER — IBUPROFEN 600 MG PO TABS: 600.0000 mg | ORAL_TABLET | Freq: Four times a day (QID) | ORAL | 0 refills | Status: AC | PRN

## 2022-10-26 MED ORDER — PSEUDOEPHEDRINE HCL 60 MG PO TABS: 60.0000 mg | ORAL_TABLET | Freq: Three times a day (TID) | ORAL | 0 refills | Status: AC | PRN

## 2022-10-26 MED ORDER — ONDANSETRON 4 MG PO TBDP
4.0000 mg | ORAL_TABLET | Freq: Once | ORAL | Status: AC
  Administered 2022-10-26: 4 mg via ORAL

## 2022-10-26 NOTE — Discharge Instructions (Signed)
We will notify you of your test results as they arrive and may take between about 24 hours.  I encourage you to sign up for MyChart if you have not already done so as this can be the easiest way for [place] to communicate results to you online or through a phone app.  Generally, we only contact you if it is a positive test result.  In the meantime, if you develop worsening symptoms including fever, chest pain, shortness of breath despite our current treatment plan then please report to the emergency room as this may be a sign of worsening status from possible viral infection.  Otherwise, we will manage this as a viral syndrome. For sore throat or cough try using a honey-based tea. Use 3 teaspoons of honey with juice squeezed from half lemon. Place shaved pieces of ginger into 1/2-1 cup of water and warm over stove top. Then mix the ingredients and repeat every 4 hours as needed. Please take Tylenol 500mg -650mg  every 6 hours for aches and pains, fevers. You can take this with ibuprofen. Hydrate very well with at least 2 liters of water. Eat light meals such as soups to replenish electrolytes and soft fruits, veggies. Start an antihistamine like Zyrtec (10mg  daily) for postnasal drainage, sinus congestion.  You can take this together with pseudoephedrine (Sudafed) at a dose of 60 mg 2-3 times a day as needed for the same kind of congestion.  Use the Zofran as a nausea and vomiting medication as needed.

## 2022-10-26 NOTE — ED Provider Notes (Signed)
[facility]  Note:  This document was prepared using Conservation officer, historic buildings and may include unintentional dictation errors.  MRN: [id number] DOB: 08/10/1990  Subjective:   Jacob Mcdowell is a 32 y.o. male presenting for 1 day history of fever, body pains, nausea without vomiting.  No eye pain, runny or stuffy nose, sore throat, ear pain, cough, chest pain, shortness of breath or wheezing, diarrhea, loose stools.  No history of respiratory disorders.  No current facility-administered medications for this encounter.  Current Outpatient Medications:    famotidine (PEPCID) 20 MG tablet, Take 20 mg by mouth at bedtime as needed., Disp: , Rfl:    RABEprazole (ACIPHEX) 20 MG tablet, Take by mouth., Disp: , Rfl:    sertraline (ZOLOFT) 50 MG tablet, TAKE 1 TABLET BY MOUTH EVERY DAY, Disp: 30 tablet, Rfl: 1   terbinafine (LAMISIL) 250 MG tablet, Take 1 tablet (250 mg total) by mouth daily., Disp: 90 tablet, Rfl: 0   No Known Allergies  Past Medical History:  Diagnosis Date   Depression      History reviewed. No pertinent surgical history.  History reviewed. No pertinent family history.  Social History   Tobacco Use   Smoking status: Never    Passive exposure: Never  Vaping Use   Vaping Use: Never used  Substance Use Topics   Alcohol use: Yes    Comment: Social   Drug use: Yes    Types: Marijuana    ROS   Objective:   Vitals: BP 108/73 (BP Location: Left Arm)   Pulse 84   Temp 99.7 F (37.6 C) (Oral)   Resp 18   SpO2 99%   Physical Exam Constitutional:      General: He is not in acute distress.    Appearance: Normal appearance. He is well-developed and normal weight. He is not ill-appearing, toxic-appearing or diaphoretic.  HENT:     Head: Normocephalic and atraumatic.     Right Ear: Tympanic membrane, ear canal and external ear normal. No drainage, swelling or tenderness. No middle ear effusion. There is no impacted cerumen.  Tympanic membrane is not erythematous or bulging.     Left Ear: Tympanic membrane, ear canal and external ear normal. No drainage, swelling or tenderness.  No middle ear effusion. There is no impacted cerumen. Tympanic membrane is not erythematous or bulging.     Nose: Nose normal. No congestion or rhinorrhea.     Mouth/Throat:     Mouth: Mucous membranes are moist.     Pharynx: No oropharyngeal exudate or posterior oropharyngeal erythema.  Eyes:     General: No scleral icterus.       Right eye: No discharge.        Left eye: No discharge.     Extraocular Movements: Extraocular movements intact.     Conjunctiva/sclera: Conjunctivae normal.  Cardiovascular:     Rate and Rhythm: Normal rate and regular rhythm.     Heart sounds: Normal heart sounds. No murmur heard.    No friction rub. No gallop.  Pulmonary:     Effort: Pulmonary effort is normal. No respiratory distress.     Breath sounds: Normal breath sounds. No stridor. No wheezing, rhonchi or rales.  Abdominal:     General: Bowel sounds are normal. There is no distension.     Palpations: Abdomen is soft. There is no mass.     Tenderness: There is no abdominal tenderness. There is no right CVA tenderness, left CVA  tenderness, guarding or rebound.  Musculoskeletal:     Cervical back: Normal range of motion and neck supple. No rigidity. No muscular tenderness.  Skin:    General: Skin is warm and dry.  Neurological:     General: No focal deficit present.     Mental Status: He is alert and oriented to person, place, and time.  Psychiatric:        Mood and Affect: Mood normal.        Behavior: Behavior normal.        Thought Content: Thought content normal.        Judgment: Judgment normal.     Results for orders placed or performed during the hospital encounter of 10/26/22 (from the past 24 hour(s))  POCT Influenza A/B     Status: None   Collection Time: 10/26/22  1:41 PM  Result Value Ref Range   Influenza A, POC Negative  Negative   Influenza B, POC Negative Negative    Assessment and Plan :   PDMP not reviewed this encounter.  1. Acute viral syndrome    P.o. Zofran given in clinic. Deferred imaging given clear cardiopulmonary exam, hemodynamically stable vital signs.  Will manage for viral illness such as viral URI, viral syndrome, viral rhinitis, COVID-19. Recommended supportive care. Offered scripts for symptomatic relief. Testing is pending. Counseled patient on potential for adverse effects with medications prescribed/recommended today, ER and return-to-clinic precautions discussed, patient verbalized understanding.   Patient would like to get treatment with Paxlovid should he test positive for COVID-19.   Wallis Bamberg, [place] 10/26/22 [phone]

## 2022-10-26 NOTE — ED Triage Notes (Signed)
Pt reports body aches, nausea x 1 day. Pt has not taken any meds for complaints.

## 2022-10-27 ENCOUNTER — Ambulatory Visit
Admission: EM | Admit: 2022-10-27 | Discharge: 2022-10-27 | Disposition: A | Discharge status: Home / Self Care | Payer: BC Managed Care – PPO | Attending: Urgent Care | Admitting: Urgent Care

## 2022-10-27 DIAGNOSIS — R197 Diarrhea, unspecified: Secondary | ICD-10-CM | POA: Diagnosis not present

## 2022-10-27 DIAGNOSIS — B349 Viral infection, unspecified: Secondary | ICD-10-CM | POA: Insufficient documentation

## 2022-10-27 DIAGNOSIS — R112 Nausea with vomiting, unspecified: Secondary | ICD-10-CM | POA: Insufficient documentation

## 2022-10-27 DIAGNOSIS — R509 Fever, unspecified: Secondary | ICD-10-CM | POA: Insufficient documentation

## 2022-10-27 LAB — POCT URINALYSIS DIP (MANUAL ENTRY)
Bilirubin, UA: NEGATIVE
Glucose, UA: NEGATIVE mg/dL
Leukocytes, UA: NEGATIVE
Nitrite, UA: NEGATIVE
Protein Ur, POC: 30 mg/dL — AB
Spec Grav, UA: 1.02 (ref 1.010–1.025)
Urobilinogen, UA: 0.2 E.U./dL
pH, UA: 6 (ref 5.0–8.0)

## 2022-10-27 LAB — SARS CORONAVIRUS 2 (TAT 6-24 HRS): SARS Coronavirus 2: NEGATIVE

## 2022-10-27 LAB — POCT RAPID STREP A (OFFICE): Rapid Strep A Screen: NEGATIVE

## 2022-10-27 MED ORDER — SODIUM CHLORIDE 0.9 % IV BOLUS
1000.0000 mL | Freq: Once | INTRAVENOUS | Status: AC
  Administered 2022-10-27: 1000 mL via INTRAVENOUS

## 2022-10-27 MED ORDER — LOPERAMIDE HCL 2 MG PO CAPS: 2.0000 mg | ORAL_CAPSULE | Freq: Two times a day (BID) | ORAL | 0 refills | Status: AC | PRN

## 2022-10-27 NOTE — ED Provider Notes (Signed)
[facility]  Note:  This document was prepared using Conservation officer, historic buildings and may include unintentional dictation errors.  MRN: [id number] DOB: September 21, 1990  Subjective:   Jacob Mcdowell is a 32 y.o. male presenting for 2-day history of persistent malaise, fatigue.  Was seen yesterday, tested negative for flu and COVID.  Thought he was going to feel better this morning but ended up having rapid decline.  Is not having diarrhea in addition to his persistent nausea.  Still has vomiting.  No cough, chest pain, shortness of breath, runny or stuffy nose, sore throat, ear pain, rashes.  No fever, recent antibiotic use, hospitalizations or long distance travel.  Has not eaten raw foods, drank unfiltered water.  No history of GI disorders including Crohn's, IBS, ulcerative colitis. Has a history of IBS, takes a probiotic daily to help with his gut.  No alcohol or drug use.  No current facility-administered medications for this encounter.  Current Outpatient Medications:    cetirizine (ZYRTEC ALLERGY) 10 MG tablet, Take 1 tablet (10 mg total) by mouth daily., Disp: 30 tablet, Rfl: 0   famotidine (PEPCID) 20 MG tablet, Take 20 mg by mouth at bedtime as needed., Disp: , Rfl:    ibuprofen (ADVIL) 600 MG tablet, Take 1 tablet (600 mg total) by mouth every 6 (six) hours as needed., Disp: 30 tablet, Rfl: 0   ondansetron (ZOFRAN-ODT) 8 MG disintegrating tablet, Take 1 tablet (8 mg total) by mouth every 8 (eight) hours as needed for nausea or vomiting., Disp: 20 tablet, Rfl: 0   pseudoephedrine (SUDAFED) 60 MG tablet, Take 1 tablet (60 mg total) by mouth every 8 (eight) hours as needed for congestion., Disp: 30 tablet, Rfl: 0   RABEprazole (ACIPHEX) 20 MG tablet, Take by mouth., Disp: , Rfl:    sertraline (ZOLOFT) 50 MG tablet, TAKE 1 TABLET BY MOUTH EVERY DAY, Disp: 30 tablet, Rfl: 1   terbinafine (LAMISIL) 250 MG tablet, Take 1 tablet (250 mg total) by mouth daily., Disp:  90 tablet, Rfl: 0   No Known Allergies  Past Medical History:  Diagnosis Date   Depression      History reviewed. No pertinent surgical history.  No family history on file.  Social History   Tobacco Use   Smoking status: Never    Passive exposure: Never   Smokeless tobacco: Never  Vaping Use   Vaping Use: Never used  Substance Use Topics   Alcohol use: Yes    Comment: Social   Drug use: Not Currently    Types: Marijuana    ROS   Objective:   Vitals: BP 121/70 (BP Location: Right Arm)   Pulse (!) 106   Temp (!) 100.5 F (38.1 C) (Oral)   Resp 20   SpO2 96%   Physical Exam Constitutional:      General: He is not in acute distress.    Appearance: Normal appearance. He is well-developed and normal weight. He is not ill-appearing, toxic-appearing or diaphoretic.  HENT:     Head: Normocephalic and atraumatic.     Right Ear: Tympanic membrane, ear canal and external ear normal. No drainage, swelling or tenderness. No middle ear effusion. There is no impacted cerumen. Tympanic membrane is not erythematous or bulging.     Left Ear: Tympanic membrane, ear canal and external ear normal. No drainage, swelling or tenderness.  No middle ear effusion. There is no impacted cerumen. Tympanic membrane is not erythematous or bulging.  Nose: Nose normal. No congestion or rhinorrhea.     Mouth/Throat:     Mouth: Mucous membranes are moist.     Pharynx: No oropharyngeal exudate or posterior oropharyngeal erythema.  Eyes:     General: No scleral icterus.       Right eye: No discharge.        Left eye: No discharge.     Extraocular Movements: Extraocular movements intact.     Conjunctiva/sclera: Conjunctivae normal.  Cardiovascular:     Rate and Rhythm: Normal rate and regular rhythm.     Heart sounds: Normal heart sounds. No murmur heard.    No friction rub. No gallop.  Pulmonary:     Effort: Pulmonary effort is normal. No respiratory distress.     Breath sounds: Normal  breath sounds. No stridor. No wheezing, rhonchi or rales.  Abdominal:     General: Bowel sounds are normal. There is no distension.     Palpations: Abdomen is soft. There is no mass.     Tenderness: There is no abdominal tenderness. There is no right CVA tenderness, left CVA tenderness, guarding or rebound.  Musculoskeletal:     Cervical back: Normal range of motion and neck supple. No rigidity. No muscular tenderness.  Neurological:     General: No focal deficit present.     Mental Status: He is alert and oriented to person, place, and time.  Psychiatric:        Mood and Affect: Mood normal.        Behavior: Behavior normal.        Thought Content: Thought content normal.        Judgment: Judgment normal.    Results for orders placed or performed during the hospital encounter of 10/27/22 (from the past 24 hour(s))  POCT urinalysis dipstick     Status: Abnormal   Collection Time: 10/27/22  6:37 PM  Result Value Ref Range   Color, UA yellow yellow   Clarity, UA clear clear   Glucose, UA negative negative mg/dL   Bilirubin, UA negative negative   Ketones, POC UA large (80) (A) negative mg/dL   Spec Grav, UA 1.610 9.604 - 1.025   Blood, UA trace-intact (A) negative   pH, UA 6.0 5.0 - 8.0   Protein Ur, POC =30 (A) negative mg/dL   Urobilinogen, UA 0.2 0.2 or 1.0 E.U./dL   Nitrite, UA Negative Negative   Leukocytes, UA Negative Negative  POCT rapid strep A     Status: None   Collection Time: 10/27/22  6:50 PM  Result Value Ref Range   Rapid Strep A Screen Negative Negative   IV fluid bolus administered over 1000 cc over period of 46 minutes.  Assessment and Plan :   PDMP not reviewed this encounter.  1. Acute viral syndrome   2. Nausea vomiting and diarrhea    Rehydration as above.  Suspect viral gastroenteritis, viral syndrome. Deferred imaging given clear cardiopulmonary exam, hemodynamically stable vital signs.  Strep culture pending.  Physical exam findings reassuring  and vital signs stable for discharge. Advised supportive care, offered symptomatic relief. Counseled patient on potential for adverse effects with medications prescribed/recommended today, ER and return-to-clinic precautions discussed, patient verbalized understanding.     Wallis Bamberg, [place] 10/27/22 1914

## 2022-10-27 NOTE — ED Triage Notes (Signed)
Pt c/o con't fever, n/v/d-seen here for same yesterday-states he felt better then started feeling worse after eating a sandwich ~2pm-last motrin/tylenol 12p-NAD-steady gait

## 2022-10-27 NOTE — Discharge Instructions (Addendum)
We will manage this as a viral illness. For sore throat or cough try using a honey-based tea. Use 3 teaspoons of honey with juice squeezed from half lemon. Place shaved pieces of ginger into 1/2-1 cup of water and warm over stove top. Then mix the ingredients and repeat every 4 hours as needed. Please take ibuprofen 600mg  every 6 hours with food alternating with OR taken together with Tylenol 500mg -650mg  every 6 hours for throat pain, fevers, aches and pains. Hydrate very well with at least 2 liters of water. Eat light meals such as soups (chicken and noodles, vegetable, chicken and wild rice).  Do not eat foods that you are allergic to.  Taking an antihistamine like Zyrtec (10mg  daily) can help against postnasal drainage, sinus congestion which can cause sinus pain, sinus headaches, throat pain, painful swallowing, coughing.  You can take this together with pseudoephedrine (Sudafed) at a dose of 30-60 mg 3 times a day or twice daily as needed for the same kind of nasal drip, congestion.  Make sure you push fluids drinking mostly water but mix it with Gatorade.  Try to eat light meals including soups, broths and soft foods, fruits.  You may use Zofran for your nausea and vomiting once every 8 hours.  Imodium can help with diarrhea but use this carefully limiting it to 1-2 times per day only if you are having a lot of diarrhea.

## 2022-10-28 ENCOUNTER — Emergency Department (HOSPITAL_BASED_OUTPATIENT_CLINIC_OR_DEPARTMENT_OTHER): Payer: BC Managed Care – PPO

## 2022-10-28 ENCOUNTER — Encounter (HOSPITAL_BASED_OUTPATIENT_CLINIC_OR_DEPARTMENT_OTHER): Payer: Self-pay

## 2022-10-28 ENCOUNTER — Ambulatory Visit
Admission: EM | Admit: 2022-10-28 | Discharge: 2022-10-28 | Disposition: A | Discharge status: Home / Self Care | Payer: BC Managed Care – PPO

## 2022-10-28 ENCOUNTER — Telehealth (HOSPITAL_BASED_OUTPATIENT_CLINIC_OR_DEPARTMENT_OTHER): Payer: Self-pay | Admitting: Emergency Medicine

## 2022-10-28 ENCOUNTER — Telehealth: Payer: BC Managed Care – PPO | Admitting: Physician Assistant

## 2022-10-28 ENCOUNTER — Emergency Department (HOSPITAL_BASED_OUTPATIENT_CLINIC_OR_DEPARTMENT_OTHER)
Admission: EM | Admit: 2022-10-28 | Discharge: 2022-10-28 | Disposition: A | Discharge status: Home / Self Care | Payer: BC Managed Care – PPO | Attending: Emergency Medicine | Admitting: Emergency Medicine

## 2022-10-28 ENCOUNTER — Other Ambulatory Visit: Payer: Self-pay

## 2022-10-28 DIAGNOSIS — R1013 Epigastric pain: Secondary | ICD-10-CM | POA: Diagnosis not present

## 2022-10-28 DIAGNOSIS — R7309 Other abnormal glucose: Secondary | ICD-10-CM | POA: Diagnosis not present

## 2022-10-28 DIAGNOSIS — R197 Diarrhea, unspecified: Secondary | ICD-10-CM

## 2022-10-28 DIAGNOSIS — R109 Unspecified abdominal pain: Secondary | ICD-10-CM | POA: Diagnosis not present

## 2022-10-28 DIAGNOSIS — R7401 Elevation of levels of liver transaminase levels: Secondary | ICD-10-CM | POA: Insufficient documentation

## 2022-10-28 DIAGNOSIS — R1110 Vomiting, unspecified: Secondary | ICD-10-CM

## 2022-10-28 LAB — URINALYSIS, ROUTINE W REFLEX MICROSCOPIC
Bacteria, UA: NONE SEEN
Bilirubin Urine: NEGATIVE
Glucose, UA: NEGATIVE mg/dL
Hgb urine dipstick: NEGATIVE
Ketones, ur: NEGATIVE mg/dL
Leukocytes,Ua: NEGATIVE
Nitrite: NEGATIVE
Protein, ur: NEGATIVE mg/dL
Specific Gravity, Urine: 1.023 (ref 1.005–1.030)
pH: 6.5 (ref 5.0–8.0)

## 2022-10-28 LAB — CBC WITH DIFFERENTIAL/PLATELET
Abs Immature Granulocytes: 0.02 10*3/uL (ref 0.00–0.07)
Basophils Absolute: 0 10*3/uL (ref 0.0–0.1)
Basophils Relative: 0 %
Eosinophils Absolute: 0.1 10*3/uL (ref 0.0–0.5)
Eosinophils Relative: 2 %
HCT: 41.9 % (ref 39.0–52.0)
Hemoglobin: 14.3 g/dL (ref 13.0–17.0)
Immature Granulocytes: 0 %
Lymphocytes Relative: 15 %
Lymphs Abs: 0.7 10*3/uL (ref 0.7–4.0)
MCH: 31.8 pg (ref 26.0–34.0)
MCHC: 34.1 g/dL (ref 30.0–36.0)
MCV: 93.1 fL (ref 80.0–100.0)
Monocytes Absolute: 0.1 10*3/uL (ref 0.1–1.0)
Monocytes Relative: 1 %
Neutro Abs: 4.1 10*3/uL (ref 1.7–7.7)
Neutrophils Relative %: 82 %
Platelets: 183 10*3/uL (ref 150–400)
RBC: 4.5 MIL/uL (ref 4.22–5.81)
RDW: 12.4 % (ref 11.5–15.5)
WBC: 5 10*3/uL (ref 4.0–10.5)
nRBC: 0 % (ref 0.0–0.2)

## 2022-10-28 LAB — COMPREHENSIVE METABOLIC PANEL
ALT: 182 U/L — ABNORMAL HIGH (ref 0–44)
AST: 188 U/L — ABNORMAL HIGH (ref 15–41)
Albumin: 4.3 g/dL (ref 3.5–5.0)
Alkaline Phosphatase: 43 U/L (ref 38–126)
Anion gap: 11 (ref 5–15)
BUN: 11 mg/dL (ref 6–20)
CO2: 27 mmol/L (ref 22–32)
Calcium: 8.7 mg/dL — ABNORMAL LOW (ref 8.9–10.3)
Chloride: 102 mmol/L (ref 98–111)
Creatinine, Ser: 0.91 mg/dL (ref 0.61–1.24)
GFR, Estimated: >60 mL/min (ref >60)
Glucose, Bld: 122 mg/dL — ABNORMAL HIGH (ref 70–99)
Potassium: 3.5 mmol/L (ref 3.5–5.1)
Sodium: 140 mmol/L (ref 135–145)
Total Bilirubin: 0.8 mg/dL (ref 0.3–1.2)
Total Protein: 7.1 g/dL (ref 6.5–8.1)

## 2022-10-28 LAB — LIPASE, BLOOD: Lipase: 26 U/L (ref 11–51)

## 2022-10-28 LAB — LACTIC ACID, PLASMA: Lactic Acid, Venous: 1.6 mmol/L (ref 0.5–1.9)

## 2022-10-28 MED ORDER — CELECOXIB 100 MG PO CAPS: 100.0000 mg | ORAL_CAPSULE | Freq: Two times a day (BID) | ORAL | 0 refills | Status: DC | PRN

## 2022-10-28 MED ORDER — PANTOPRAZOLE SODIUM 40 MG IV SOLR
40.0000 mg | Freq: Once | INTRAVENOUS | Status: AC
  Administered 2022-10-28: 40 mg via INTRAVENOUS
  Filled 2022-10-28: qty 10

## 2022-10-28 MED ORDER — IOHEXOL 300 MG/ML  SOLN
100.0000 mL | Freq: Once | INTRAMUSCULAR | Status: AC | PRN
  Administered 2022-10-28: 80 mL via INTRAVENOUS

## 2022-10-28 MED ORDER — SODIUM CHLORIDE 0.9 % IV BOLUS
1000.0000 mL | Freq: Once | INTRAVENOUS | Status: AC
  Administered 2022-10-28: 1000 mL via INTRAVENOUS

## 2022-10-28 MED ORDER — ALUM & MAG HYDROXIDE-SIMETH 200-200-20 MG/5ML PO SUSP
30.0000 mL | Freq: Once | ORAL | Status: AC
  Administered 2022-10-28: 30 mL via ORAL
  Filled 2022-10-28: qty 30

## 2022-10-28 MED ORDER — CELECOXIB 100 MG PO CAPS: 100.0000 mg | ORAL_CAPSULE | Freq: Two times a day (BID) | ORAL | 0 refills | Status: AC | PRN

## 2022-10-28 NOTE — ED Notes (Signed)
Patient is being discharged from the Urgent Care and sent to the Emergency Department via POV. Per Lennox Laity, NP, patient is in need of higher level of care due to diarrhea. Patient is aware and verbalizes understanding of plan of care.  Vitals:   10/28/22 1943  BP: 122/78  Pulse: 83  Resp: 16  Temp: 99.5 F (37.5 C)  SpO2: 99%

## 2022-10-28 NOTE — ED Triage Notes (Addendum)
Pt presents to UC w/ c/o diarrhea x4 days. States prescribed immodium not helping. Nausea improved d/t zofran and able to eat. Pt requesting IV hydration.

## 2022-10-28 NOTE — ED Notes (Signed)
Patient to CT.

## 2022-10-28 NOTE — ED Notes (Signed)
Patient verbalizes understanding of discharge instructions. Opportunity for questioning and answers were provided. Patient discharged from ED.  °

## 2022-10-28 NOTE — ED Provider Notes (Signed)
EMERGENCY DEPARTMENT AT [facility] Provider Note   CSN: [id number] Arrival date & time: 10/28/22  0350     History  Chief Complaint  Patient presents with   Abdominal Pain    Jacob Mcdowell is a 32 y.o. male.  The history is provided by the patient.  Abdominal Pain He has history of depression, elevated liver enzymes and comes in because of epigastric pain which started this morning.  Pain does not radiate.  He has been having nausea, vomiting, diarrhea for the last 3 days and has had fever which has been as high as 101.  He has had chills but no sweats.  He denies blood or mucus in stool or emesis.  He was seen at urgent care and received a prescription for ondansetron which does control the nausea.  He has taken no over-the-counter antidiarrhea medicine which has slowed his diarrhea down (last bowel movement approximately 6 hours ago).  However, he woke up this morning with severe epigastric pain.  He has not taken anything for that pain.   Home Medications Prior to Admission medications   Medication Sig Start Date End Date Taking? Authorizing Provider  cetirizine (ZYRTEC ALLERGY) 10 MG tablet Take 1 tablet (10 mg total) by mouth daily. 10/26/22   Wallis Bamberg, PA-C  famotidine (PEPCID) 20 MG tablet Take 20 mg by mouth at bedtime as needed. 06/14/22   [provider]  ibuprofen (ADVIL) 600 MG tablet Take 1 tablet (600 mg total) by mouth every 6 (six) hours as needed. 10/26/22   Wallis Bamberg, PA-C  loperamide (IMODIUM) 2 MG capsule Take 1 capsule (2 mg total) by mouth 2 (two) times daily as needed for diarrhea or loose stools. 10/27/22   Wallis Bamberg, PA-C  ondansetron (ZOFRAN-ODT) 8 MG disintegrating tablet Take 1 tablet (8 mg total) by mouth every 8 (eight) hours as needed for nausea or vomiting. 10/26/22   Wallis Bamberg, PA-C  pseudoephedrine (SUDAFED) 60 MG tablet Take 1 tablet (60 mg total) by mouth every 8 (eight) hours as needed for congestion. 10/26/22   Wallis Bamberg, PA-C  RABEprazole (ACIPHEX) 20 MG tablet Take by mouth. 06/25/21   [provider]  sertraline (ZOLOFT) 50 MG tablet TAKE 1 TABLET BY MOUTH EVERY DAY    Weber, Sarah L, PA-C  terbinafine (LAMISIL) 250 MG tablet Take 1 tablet (250 mg total) by mouth daily. 06/04/16   Hyatt, Max T, DPM      Allergies    Patient has no known allergies.    Review of Systems   Review of Systems  Gastrointestinal:  Positive for abdominal pain.  All other systems reviewed and are negative.   Physical Exam Updated Vital Signs BP 108/70   Pulse 85   Temp 97.7 F (36.5 C) (Oral)   Resp 18   SpO2 100%  Physical Exam Vitals and nursing note reviewed.   32 year old male, resting comfortably and in no acute distress. Vital signs are normal. Oxygen saturation is 100%, which is normal. Head is normocephalic and atraumatic. PERRLA, EOMI. Oropharynx is clear. Neck is nontender and supple without adenopathy or JVD. Back is nontender and there is no CVA tenderness. Lungs are clear without rales, wheezes, or rhonchi. Chest is nontender. Heart has regular rate and rhythm without murmur. Abdomen is soft, flat, with moderate epigastric tenderness.  There is no rebound or guarding. Extremities have no cyanosis or edema, full range of motion is present. Skin is warm and dry without  rash. Neurologic: Mental status is normal, cranial nerves are intact, moves all extremities equally.  ED Results / Procedures / Treatments   Labs (all labs ordered are listed, but only abnormal results are displayed) Labs Reviewed  COMPREHENSIVE METABOLIC PANEL - Abnormal; Notable for the following components:      Result Value   Glucose, Bld 122 (*)    Calcium 8.7 (*)    AST 188 (*)    ALT 182 (*)    All other components within normal limits  STOOL CULTURE  LIPASE, BLOOD  LACTIC ACID, PLASMA  CBC WITH DIFFERENTIAL/PLATELET  URINALYSIS, ROUTINE W REFLEX MICROSCOPIC   Radiology No results  found.  Procedures Procedures    Medications Ordered in ED Medications  sodium chloride 0.9 % bolus 1,000 mL (has no administration in time range)  pantoprazole (PROTONIX) injection 40 mg (has no administration in time range)    ED Course/ Medical Decision Making/ A&P                             Medical Decision Making Amount and/or Complexity of Data Reviewed Labs: ordered. Radiology: ordered.  Risk OTC drugs. Prescription drug management.   Epigastric pain in the setting of fever and vomiting and diarrhea.  I have reviewed his past records, and he urgent care visits yesterday and the day before that and diagnosed with viral illness.  Per those notes he actually was not complaining of diarrhea to them and was diagnosed with viral illness after having negative test for influenza and strep and COVID.  He also had an ED visit on 06/27/2021 for elevated liver enzymes at which time right upper quadrant ultrasound was negative.  I have reviewed and interpreted his laboratory test, my interpretation is elevated transaminases which have decreased compared with 06/26/2021, elevated random glucose, normal CBC, normal lipase.  I suspect his epigastric pain is related to GERD or peptic ulcer disease and I have ordered intravenous fluids and pantoprazole.  I have ordered CT of abdomen and pelvis to rule out serious intra-abdominal pathology.  He has noted minimal improvement with above-noted treatment, I have ordered a dose of an oral antacid.  CT of abdomen and pelvis shows small amount of periportal edema which correlates with his elevated transaminases, no acute intra-abdominal process.  I have independently viewed the images, and agree with the radiologist's interpretation.  I have discussed with him further and he did take a large dose of ibuprofen last night.  I suspect that his pain is from gastritis from NSAID irritation.  He takes famotidine every night and adds Nexium as needed.  I have  instructed him to start taking Nexium on an every day basis for the next 2 weeks.  I have recommended he follow-up with his gastroenterologist to decide if they want to do additional evaluation at this elevated liver enzymes.  I have ordered a prescription for celecoxib to use if he needs something more than acetaminophen for pain since that should be less irritating to his stomach.  Final Clinical Impression(s) / ED Diagnoses Final diagnoses:  Epigastric pain  Elevated transaminase level  Elevated random blood glucose level    Rx / DC Orders ED Discharge Orders          Ordered    celecoxib (CELEBREX) 100 MG capsule  2 times daily PRN        10/28/22 0719  Dione Booze, MD 10/28/22 [phone]

## 2022-10-28 NOTE — ED Triage Notes (Signed)
Pt c/o mid upper abdominal that started this morning. Pt states have NVD, fevers, and chills since Sunday. Emesis x2 and Diarrhea x10, described as water. Was seen at Urgent Care x2 earlier this week dx viral gastroenteritis. Endorses travel to [place]. No known sick exposure.

## 2022-10-28 NOTE — ED Notes (Signed)
Pt informed to provide urine and stool sample when able. Call bell within reach

## 2022-10-28 NOTE — Discharge Instructions (Addendum)
Please take your Nexium once a day for the next 2 weeks.  Do not take ibuprofen since it can upset your stomach.  Limit acetaminophen to no more than 650 mg 4 times a day.  Higher doses can hurt your liver.  Please follow-up with your gastroenterologist to decide whether you need additional evaluation for your elevated liver enzymes.  You may take antacids such as Maalox, Mylanta, Pepto-Bismol as needed.  Return if symptoms or not being adequately controlled at home, or if symptoms are getting worse.

## 2022-10-29 LAB — STOOL CULTURE

## 2022-10-29 NOTE — Progress Notes (Signed)
Because of severity of symptoms and inability to keep hydrated, I feel your condition warrants further evaluation and I recommend that you be seen in a face to face visit.   NOTE: There will be NO CHARGE for this eVisit   If you are having a true medical emergency please call 911.      For an urgent face to face visit, [facility] has eight urgent care centers for your convenience:   NEW!! [facility] Health Urgent Care Center at [facility] Get Driving Directions [phone] [address] [place], [address]    [facility] Health Urgent Care Center at [facility] Get Driving Directions [phone] [address] [place], [place] [address]   [facility] Health Urgent Care Center [facility]) Get Driving Directions [phone] [address] [place], [place] [address]  [facility] Health Urgent Care Center [facility]) Get Driving Directions [phone] [address] [place],  [place]  [address]  [facility] Health Urgent Care Center [facility] - at [employer]  [phone] [address] [place],  [place] [address]   [facility] Health Urgent Care at [facility] Get Driving Directions [phone] [address] [place], [place] [address]   [facility] Health Urgent Care at [facility] Get Driving Directions  [phone] [address] [place], [place] [address]   [facility] Health Urgent Care at [facility] Directions [phone] [address], [address] [place], [place] [address]  Your MyChart E-visit questionnaire answers were reviewed by a board certified advanced clinical practitioner to complete your personal care plan based on your specific symptoms.  Thank you for using e-Visits.

## 2022-10-30 LAB — CULTURE, GROUP A STREP (THRC)

## 2022-11-06 DIAGNOSIS — R1013 Epigastric pain: Secondary | ICD-10-CM | POA: Diagnosis not present

## 2022-11-06 DIAGNOSIS — R945 Abnormal results of liver function studies: Secondary | ICD-10-CM | POA: Diagnosis not present

## 2022-11-06 DIAGNOSIS — K219 Gastro-esophageal reflux disease without esophagitis: Secondary | ICD-10-CM | POA: Diagnosis not present

## 2022-11-06 DIAGNOSIS — R7989 Other specified abnormal findings of blood chemistry: Secondary | ICD-10-CM | POA: Diagnosis not present

## 2022-12-04 DIAGNOSIS — R899 Unspecified abnormal finding in specimens from other organs, systems and tissues: Secondary | ICD-10-CM | POA: Diagnosis not present

## 2023-01-21 DIAGNOSIS — K219 Gastro-esophageal reflux disease without esophagitis: Secondary | ICD-10-CM | POA: Diagnosis not present

## 2023-01-21 DIAGNOSIS — R748 Abnormal levels of other serum enzymes: Secondary | ICD-10-CM | POA: Diagnosis not present

## 2023-01-21 DIAGNOSIS — F329 Major depressive disorder, single episode, unspecified: Secondary | ICD-10-CM | POA: Diagnosis not present

## 2023-02-18 DIAGNOSIS — F321 Major depressive disorder, single episode, moderate: Secondary | ICD-10-CM | POA: Diagnosis not present

## 2023-02-18 DIAGNOSIS — F411 Generalized anxiety disorder: Secondary | ICD-10-CM | POA: Diagnosis not present

## 2023-04-22 DIAGNOSIS — R945 Abnormal results of liver function studies: Secondary | ICD-10-CM | POA: Diagnosis not present

## 2023-04-22 DIAGNOSIS — R152 Fecal urgency: Secondary | ICD-10-CM | POA: Diagnosis not present

## 2023-04-22 DIAGNOSIS — K219 Gastro-esophageal reflux disease without esophagitis: Secondary | ICD-10-CM | POA: Diagnosis not present

## 2023-04-22 DIAGNOSIS — R1013 Epigastric pain: Secondary | ICD-10-CM | POA: Diagnosis not present

## 2023-05-18 DIAGNOSIS — F411 Generalized anxiety disorder: Secondary | ICD-10-CM | POA: Diagnosis not present

## 2023-05-18 DIAGNOSIS — F321 Major depressive disorder, single episode, moderate: Secondary | ICD-10-CM | POA: Diagnosis not present

## 2023-05-29 DIAGNOSIS — F411 Generalized anxiety disorder: Secondary | ICD-10-CM | POA: Diagnosis not present

## 2023-05-29 DIAGNOSIS — F32A Depression, unspecified: Secondary | ICD-10-CM | POA: Diagnosis not present

## 2023-05-29 DIAGNOSIS — F439 Reaction to severe stress, unspecified: Secondary | ICD-10-CM | POA: Diagnosis not present

## 2023-06-03 DIAGNOSIS — F411 Generalized anxiety disorder: Secondary | ICD-10-CM | POA: Diagnosis not present

## 2023-06-03 DIAGNOSIS — F321 Major depressive disorder, single episode, moderate: Secondary | ICD-10-CM | POA: Diagnosis not present

## 2023-06-08 DIAGNOSIS — F411 Generalized anxiety disorder: Secondary | ICD-10-CM | POA: Diagnosis not present

## 2023-06-28 DIAGNOSIS — F411 Generalized anxiety disorder: Secondary | ICD-10-CM | POA: Diagnosis not present

## 2023-07-01 DIAGNOSIS — F321 Major depressive disorder, single episode, moderate: Secondary | ICD-10-CM | POA: Diagnosis not present

## 2023-07-01 DIAGNOSIS — F411 Generalized anxiety disorder: Secondary | ICD-10-CM | POA: Diagnosis not present

## 2023-07-21 ENCOUNTER — Ambulatory Visit
Admission: EM | Admit: 2023-07-21 | Discharge: 2023-07-21 | Disposition: A | Discharge status: Home / Self Care | Payer: Self-pay | Attending: Family Medicine | Admitting: Family Medicine

## 2023-07-21 DIAGNOSIS — J069 Acute upper respiratory infection, unspecified: Secondary | ICD-10-CM

## 2023-07-21 MED ORDER — PROMETHAZINE-DM 6.25-15 MG/5ML PO SYRP
5.0000 mL | ORAL_SOLUTION | Freq: Four times a day (QID) | ORAL | 0 refills | Status: AC | PRN
Start: 2023-07-21 — End: ?

## 2023-07-21 NOTE — ED Triage Notes (Signed)
Pt presents to UC for c/o congestion, pressure in face, sore throat, cough x3 days. Taking dayquil and nyquil

## 2023-07-21 NOTE — ED Provider Notes (Signed)
[facility]    CSN: [id number] Arrival date & time: 07/21/23  0831      History   Chief Complaint No chief complaint on file.   HPI Jacob Mcdowell is a 33 y.o. male  presents for evaluation of URI symptoms for 3 days. Patient reports associated symptoms of cough, congestion, sore throat, sinus pressure. Denies N/V/D, ear pain, body aches, shortness of breath. Patient does not have a hx of asthma. Patient is not an active smoker.   Reports no sick contacts.  Pt has taken DayQuil and NyQuil OTC for symptoms. Pt has no other concerns at this time.   HPI  Past Medical History:  Diagnosis Date   Depression     There are no active problems to display for this patient.   History reviewed. No pertinent surgical history.     Home Medications    Prior to Admission medications   Medication Sig Start Date End Date Taking? Authorizing Provider  famotidine (PEPCID) 20 MG tablet Take 20 mg by mouth at bedtime as needed. 06/14/22  Yes [provider]  promethazine-dextromethorphan (PROMETHAZINE-DM) 6.25-15 MG/5ML syrup Take 5 mLs by mouth 4 (four) times daily as needed for cough. 07/21/23  Yes Radford Pax, NP  sertraline (ZOLOFT) 50 MG tablet TAKE 1 TABLET BY MOUTH EVERY DAY   Yes Weber, Sarah L, PA-C  celecoxib (CELEBREX) 100 MG capsule Take 1 capsule (100 mg total) by mouth 2 (two) times daily as needed for mild pain or moderate pain. 10/28/22   Dione Booze, MD  cetirizine (ZYRTEC ALLERGY) 10 MG tablet Take 1 tablet (10 mg total) by mouth daily. 10/26/22   Wallis Bamberg, PA-C  ibuprofen (ADVIL) 600 MG tablet Take 1 tablet (600 mg total) by mouth every 6 (six) hours as needed. 10/26/22   Wallis Bamberg, PA-C  loperamide (IMODIUM) 2 MG capsule Take 1 capsule (2 mg total) by mouth 2 (two) times daily as needed for diarrhea or loose stools. 10/27/22   Wallis Bamberg, PA-C  ondansetron (ZOFRAN-ODT) 8 MG disintegrating tablet Take 1 tablet (8 mg total) by mouth every 8 (eight) hours as  needed for nausea or vomiting. 10/26/22   Wallis Bamberg, PA-C  pseudoephedrine (SUDAFED) 60 MG tablet Take 1 tablet (60 mg total) by mouth every 8 (eight) hours as needed for congestion. 10/26/22   Wallis Bamberg, PA-C  RABEprazole (ACIPHEX) 20 MG tablet Take by mouth. 06/25/21   [provider]  terbinafine (LAMISIL) 250 MG tablet Take 1 tablet (250 mg total) by mouth daily. 06/04/16   Hyatt, Annye Rusk, DPM    Family History History reviewed. No pertinent family history.  Social History Social History   Tobacco Use   Smoking status: Never    Passive exposure: Never   Smokeless tobacco: Never  Vaping Use   Vaping status: Never Used  Substance Use Topics   Alcohol use: Yes    Comment: Social   Drug use: Not Currently    Types: Marijuana     Allergies   Patient has no known allergies.   Review of Systems Review of Systems  HENT:  Positive for congestion, sinus pressure and sore throat.   Respiratory:  Positive for cough.      Physical Exam Triage Vital Signs ED Triage Vitals  Encounter Vitals Group     BP 07/21/23 0855 130/77     Systolic BP Percentile --      Diastolic BP Percentile --      Pulse Rate  07/21/23 0855 94     Resp 07/21/23 0855 16     Temp 07/21/23 0855 98.8 F (37.1 C)     Temp Source 07/21/23 0855 Oral     SpO2 07/21/23 0855 95 %     Weight --      Height --      Head Circumference --      Peak Flow --      Pain Score 07/21/23 0853 0     Pain Loc --      Pain Education --      Exclude from Growth Chart --    No data found.  Updated Vital Signs BP 130/77 (BP Location: Right Arm)   Pulse 94   Temp 98.8 F (37.1 C) (Oral)   Resp 16   SpO2 95%   Visual Acuity Right Eye Distance:   Left Eye Distance:   Bilateral Distance:    Right Eye Near:   Left Eye Near:    Bilateral Near:     Physical Exam Vitals and nursing note reviewed.  Constitutional:      General: He is not in acute distress.    Appearance: Normal appearance. He is not  ill-appearing or toxic-appearing.  HENT:     Head: Normocephalic and atraumatic.     Right Ear: Tympanic membrane and ear canal normal.     Left Ear: Tympanic membrane and ear canal normal.     Nose: Congestion present.     Mouth/Throat:     Mouth: Mucous membranes are moist.     Pharynx: Posterior oropharyngeal erythema present.  Eyes:     Pupils: Pupils are equal, round, and reactive to light.  Cardiovascular:     Rate and Rhythm: Normal rate and regular rhythm.     Heart sounds: Normal heart sounds.  Pulmonary:     Effort: Pulmonary effort is normal.     Breath sounds: Normal breath sounds.  Musculoskeletal:     Cervical back: Normal range of motion and neck supple.  Lymphadenopathy:     Cervical: No cervical adenopathy.  Skin:    General: Skin is warm and dry.  Neurological:     General: No focal deficit present.     Mental Status: He is alert and oriented to person, place, and time.  Psychiatric:        Mood and Affect: Mood normal.        Behavior: Behavior normal.      UC Treatments / Results  Labs (all labs ordered are listed, but only abnormal results are displayed) Labs Reviewed - No data to display  EKG   Radiology No results found.  Procedures Procedures (including critical care time)  Medications Ordered in UC Medications - No data to display  Initial Impression / Assessment and Plan / UC Course  I have reviewed the triage vital signs and the nursing notes.  Pertinent labs & imaging results that were available during my care of the patient were reviewed by me and considered in my medical decision making (see chart for details).     Reviewed exam and symptoms with patient.  No red flags.  Defer flu flu testing due to supply issues and given length of symptoms.  Patient declined strep test or COVID test.  Discussed viral illness and symptomatic treatment.  Promethazine DM as needed for cough, side effect profile reviewed.  PCP follow-up if symptoms  do not improve.  ER precautions reviewed. Final Clinical Impressions(s) / UC Diagnoses  Final diagnoses:  Viral upper respiratory illness     Discharge Instructions      Take Promethazine DM as needed for cough.  Please note this medication can make you drowsy.  Do not drink alcohol or drive while on this medication.  Lots of rest and fluids.  Continue vitamin C.  Please follow-up with your PCP if your symptoms do not improve.  Please go to the ER for any worsening symptoms.  I hope you feel better soon!    ED Prescriptions     Medication Sig Dispense Auth. Provider   promethazine-dextromethorphan (PROMETHAZINE-DM) 6.25-15 MG/5ML syrup Take 5 mLs by mouth 4 (four) times daily as needed for cough. 118 mL Radford Pax, NP      PDMP not reviewed this encounter.   Radford Pax, NP 07/21/23 [phone]

## 2023-07-21 NOTE — Discharge Instructions (Signed)
Take Promethazine DM as needed for cough.  Please note this medication can make you drowsy.  Do not drink alcohol or drive while on this medication.  Lots of rest and fluids.  Continue vitamin C.  Please follow-up with your PCP if your symptoms do not improve.  Please go to the ER for any worsening symptoms.  I hope you feel better soon!

## 2023-08-13 DIAGNOSIS — Z23 Encounter for immunization: Secondary | ICD-10-CM | POA: Diagnosis not present

## 2023-08-13 DIAGNOSIS — R7989 Other specified abnormal findings of blood chemistry: Secondary | ICD-10-CM | POA: Diagnosis not present

## 2023-08-13 DIAGNOSIS — Z0000 Encounter for general adult medical examination without abnormal findings: Secondary | ICD-10-CM | POA: Diagnosis not present

## 2023-08-13 DIAGNOSIS — R945 Abnormal results of liver function studies: Secondary | ICD-10-CM | POA: Diagnosis not present

## 2023-08-13 DIAGNOSIS — F411 Generalized anxiety disorder: Secondary | ICD-10-CM | POA: Diagnosis not present

## 2023-08-19 DIAGNOSIS — F321 Major depressive disorder, single episode, moderate: Secondary | ICD-10-CM | POA: Diagnosis not present

## 2023-08-19 DIAGNOSIS — F411 Generalized anxiety disorder: Secondary | ICD-10-CM | POA: Diagnosis not present

## 2023-08-31 DIAGNOSIS — R5382 Chronic fatigue, unspecified: Secondary | ICD-10-CM | POA: Diagnosis not present

## 2023-08-31 DIAGNOSIS — K219 Gastro-esophageal reflux disease without esophagitis: Secondary | ICD-10-CM | POA: Diagnosis not present

## 2023-08-31 DIAGNOSIS — F419 Anxiety disorder, unspecified: Secondary | ICD-10-CM | POA: Diagnosis not present

## 2023-08-31 DIAGNOSIS — G478 Other sleep disorders: Secondary | ICD-10-CM | POA: Diagnosis not present

## 2023-09-21 DIAGNOSIS — G4710 Hypersomnia, unspecified: Secondary | ICD-10-CM | POA: Diagnosis not present

## 2023-09-27 DIAGNOSIS — F411 Generalized anxiety disorder: Secondary | ICD-10-CM | POA: Diagnosis not present

## 2023-09-27 DIAGNOSIS — F339 Major depressive disorder, recurrent, unspecified: Secondary | ICD-10-CM | POA: Diagnosis not present

## 2023-09-27 DIAGNOSIS — R5383 Other fatigue: Secondary | ICD-10-CM | POA: Diagnosis not present

## 2023-11-01 DIAGNOSIS — F339 Major depressive disorder, recurrent, unspecified: Secondary | ICD-10-CM | POA: Diagnosis not present

## 2023-11-01 DIAGNOSIS — F411 Generalized anxiety disorder: Secondary | ICD-10-CM | POA: Diagnosis not present

## 2023-11-04 DIAGNOSIS — Z3141 Encounter for fertility testing: Secondary | ICD-10-CM | POA: Diagnosis not present

## 2023-12-06 DIAGNOSIS — F330 Major depressive disorder, recurrent, mild: Secondary | ICD-10-CM | POA: Diagnosis not present

## 2023-12-06 DIAGNOSIS — F411 Generalized anxiety disorder: Secondary | ICD-10-CM | POA: Diagnosis not present

## 2023-12-16 DIAGNOSIS — F330 Major depressive disorder, recurrent, mild: Secondary | ICD-10-CM | POA: Diagnosis not present

## 2023-12-16 DIAGNOSIS — F411 Generalized anxiety disorder: Secondary | ICD-10-CM | POA: Diagnosis not present

## 2023-12-22 DIAGNOSIS — F330 Major depressive disorder, recurrent, mild: Secondary | ICD-10-CM | POA: Diagnosis not present

## 2023-12-22 DIAGNOSIS — F411 Generalized anxiety disorder: Secondary | ICD-10-CM | POA: Diagnosis not present

## 2023-12-28 DIAGNOSIS — F411 Generalized anxiety disorder: Secondary | ICD-10-CM | POA: Diagnosis not present

## 2023-12-28 DIAGNOSIS — F330 Major depressive disorder, recurrent, mild: Secondary | ICD-10-CM | POA: Diagnosis not present

## 2024-01-04 DIAGNOSIS — F330 Major depressive disorder, recurrent, mild: Secondary | ICD-10-CM | POA: Diagnosis not present

## 2024-01-04 DIAGNOSIS — F411 Generalized anxiety disorder: Secondary | ICD-10-CM | POA: Diagnosis not present

## 2024-01-11 DIAGNOSIS — F330 Major depressive disorder, recurrent, mild: Secondary | ICD-10-CM | POA: Diagnosis not present

## 2024-01-11 DIAGNOSIS — F411 Generalized anxiety disorder: Secondary | ICD-10-CM | POA: Diagnosis not present

## 2024-01-18 DIAGNOSIS — F411 Generalized anxiety disorder: Secondary | ICD-10-CM | POA: Diagnosis not present

## 2024-01-18 DIAGNOSIS — F330 Major depressive disorder, recurrent, mild: Secondary | ICD-10-CM | POA: Diagnosis not present

## 2024-01-25 DIAGNOSIS — F411 Generalized anxiety disorder: Secondary | ICD-10-CM | POA: Diagnosis not present

## 2024-01-25 DIAGNOSIS — F330 Major depressive disorder, recurrent, mild: Secondary | ICD-10-CM | POA: Diagnosis not present

## 2024-02-08 DIAGNOSIS — F411 Generalized anxiety disorder: Secondary | ICD-10-CM | POA: Diagnosis not present

## 2024-02-08 DIAGNOSIS — F330 Major depressive disorder, recurrent, mild: Secondary | ICD-10-CM | POA: Diagnosis not present

## 2024-02-10 DIAGNOSIS — K219 Gastro-esophageal reflux disease without esophagitis: Secondary | ICD-10-CM | POA: Diagnosis not present

## 2024-02-10 DIAGNOSIS — F419 Anxiety disorder, unspecified: Secondary | ICD-10-CM | POA: Diagnosis not present

## 2024-02-22 DIAGNOSIS — F411 Generalized anxiety disorder: Secondary | ICD-10-CM | POA: Diagnosis not present

## 2024-02-22 DIAGNOSIS — F330 Major depressive disorder, recurrent, mild: Secondary | ICD-10-CM | POA: Diagnosis not present

## 2024-02-29 DIAGNOSIS — F411 Generalized anxiety disorder: Secondary | ICD-10-CM | POA: Diagnosis not present

## 2024-02-29 DIAGNOSIS — F339 Major depressive disorder, recurrent, unspecified: Secondary | ICD-10-CM | POA: Diagnosis not present

## 2024-02-29 DIAGNOSIS — F330 Major depressive disorder, recurrent, mild: Secondary | ICD-10-CM | POA: Diagnosis not present

## 2024-03-06 DIAGNOSIS — F411 Generalized anxiety disorder: Secondary | ICD-10-CM | POA: Diagnosis not present

## 2024-03-06 DIAGNOSIS — F330 Major depressive disorder, recurrent, mild: Secondary | ICD-10-CM | POA: Diagnosis not present

## 2024-03-14 DIAGNOSIS — F339 Major depressive disorder, recurrent, unspecified: Secondary | ICD-10-CM | POA: Diagnosis not present

## 2024-03-14 DIAGNOSIS — F411 Generalized anxiety disorder: Secondary | ICD-10-CM | POA: Diagnosis not present

## 2024-03-15 DIAGNOSIS — F330 Major depressive disorder, recurrent, mild: Secondary | ICD-10-CM | POA: Diagnosis not present

## 2024-03-15 DIAGNOSIS — F339 Major depressive disorder, recurrent, unspecified: Secondary | ICD-10-CM | POA: Diagnosis not present

## 2024-03-15 DIAGNOSIS — F411 Generalized anxiety disorder: Secondary | ICD-10-CM | POA: Diagnosis not present

## 2024-03-28 DIAGNOSIS — F330 Major depressive disorder, recurrent, mild: Secondary | ICD-10-CM | POA: Diagnosis not present

## 2024-03-28 DIAGNOSIS — F411 Generalized anxiety disorder: Secondary | ICD-10-CM | POA: Diagnosis not present

## 2024-04-11 DIAGNOSIS — F411 Generalized anxiety disorder: Secondary | ICD-10-CM | POA: Diagnosis not present

## 2024-04-11 DIAGNOSIS — F330 Major depressive disorder, recurrent, mild: Secondary | ICD-10-CM | POA: Diagnosis not present

## 2024-04-12 DIAGNOSIS — F411 Generalized anxiety disorder: Secondary | ICD-10-CM | POA: Diagnosis not present

## 2024-04-12 DIAGNOSIS — F339 Major depressive disorder, recurrent, unspecified: Secondary | ICD-10-CM | POA: Diagnosis not present

## 2024-04-25 DIAGNOSIS — F330 Major depressive disorder, recurrent, mild: Secondary | ICD-10-CM | POA: Diagnosis not present

## 2024-04-25 DIAGNOSIS — F411 Generalized anxiety disorder: Secondary | ICD-10-CM | POA: Diagnosis not present

## 2024-05-09 DIAGNOSIS — F330 Major depressive disorder, recurrent, mild: Secondary | ICD-10-CM | POA: Diagnosis not present

## 2024-05-09 DIAGNOSIS — F411 Generalized anxiety disorder: Secondary | ICD-10-CM | POA: Diagnosis not present

## 2024-05-11 DIAGNOSIS — F411 Generalized anxiety disorder: Secondary | ICD-10-CM | POA: Diagnosis not present

## 2024-05-11 DIAGNOSIS — F339 Major depressive disorder, recurrent, unspecified: Secondary | ICD-10-CM | POA: Diagnosis not present

## 2024-05-30 DIAGNOSIS — F330 Major depressive disorder, recurrent, mild: Secondary | ICD-10-CM | POA: Diagnosis not present

## 2024-05-30 DIAGNOSIS — F411 Generalized anxiety disorder: Secondary | ICD-10-CM | POA: Diagnosis not present

## 2024-06-06 DIAGNOSIS — F330 Major depressive disorder, recurrent, mild: Secondary | ICD-10-CM | POA: Diagnosis not present

## 2024-06-06 DIAGNOSIS — F411 Generalized anxiety disorder: Secondary | ICD-10-CM | POA: Diagnosis not present

## 2024-06-13 DIAGNOSIS — F330 Major depressive disorder, recurrent, mild: Secondary | ICD-10-CM | POA: Diagnosis not present

## 2024-06-13 DIAGNOSIS — F411 Generalized anxiety disorder: Secondary | ICD-10-CM | POA: Diagnosis not present
